# Patient Record
Sex: Male | Born: 1990 | Race: White | Hispanic: No | Marital: Single | Smoking: Never smoker
Health system: Southern US, Community
[De-identification: ages and names within clinical notes are randomized; demographics above are authoritative.]

## PROBLEM LIST (undated history)

## (undated) ENCOUNTER — Emergency Department (HOSPITAL_COMMUNITY): Discharge status: Against Medical Advice | Payer: Self-pay

---
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
## 2015-07-21 ENCOUNTER — Other Ambulatory Visit: Payer: Self-pay | Admitting: Urology

## 2015-07-21 ENCOUNTER — Inpatient Hospital Stay (HOSPITAL_COMMUNITY): Payer: BC Managed Care – PPO | Admitting: Anesthesiology

## 2015-07-21 ENCOUNTER — Encounter (HOSPITAL_COMMUNITY)
Admission: AD | Disposition: A | Discharge status: Home / Self Care | Payer: Self-pay | Source: Ambulatory Visit | Attending: Urology

## 2015-07-21 ENCOUNTER — Ambulatory Visit (HOSPITAL_COMMUNITY)
Admission: AD | Admit: 2015-07-21 | Discharge: 2015-07-21 | DRG: 712 | Disposition: A | Discharge status: Home / Self Care | Payer: BC Managed Care – PPO | Source: Ambulatory Visit | Attending: Urology | Admitting: Urology

## 2015-07-21 ENCOUNTER — Encounter (HOSPITAL_COMMUNITY): Payer: Self-pay | Admitting: Anesthesiology

## 2015-07-21 DIAGNOSIS — N4400 Torsion of testis, unspecified: Secondary | ICD-10-CM | POA: Diagnosis not present

## 2015-07-21 DIAGNOSIS — N5082 Scrotal pain: Secondary | ICD-10-CM | POA: Diagnosis present

## 2015-07-21 HISTORY — PX: ORCHIECTOMY: SHX2116

## 2015-07-21 SURGERY — ORCHIECTOMY
Anesthesia: General | Laterality: Bilateral

## 2015-07-21 MED ORDER — BUPIVACAINE-EPINEPHRINE (PF) 0.5% -1:200000 IJ SOLN
INTRAMUSCULAR | Status: AC
  Filled 2015-07-21: qty 30

## 2015-07-21 MED ORDER — ONDANSETRON HCL 4 MG/2ML IJ SOLN
INTRAMUSCULAR | Status: DC | PRN
  Administered 2015-07-21: 4 mg via INTRAVENOUS

## 2015-07-21 MED ORDER — FENTANYL CITRATE (PF) 100 MCG/2ML IJ SOLN
INTRAMUSCULAR | Status: AC
  Filled 2015-07-21: qty 2

## 2015-07-21 MED ORDER — BUPIVACAINE-EPINEPHRINE (PF) 0.5% -1:200000 IJ SOLN
INTRAMUSCULAR | Status: DC | PRN
  Administered 2015-07-21: 10 mL
  Administered 2015-07-21: 30 mL

## 2015-07-21 MED ORDER — MEPERIDINE HCL 25 MG/ML IJ SOLN
INTRAMUSCULAR | Status: DC | PRN
  Administered 2015-07-21: 25 mg via INTRAVENOUS

## 2015-07-21 MED ORDER — LACTATED RINGERS IV SOLN: INTRAVENOUS | Status: DC

## 2015-07-21 MED ORDER — ONDANSETRON HCL 4 MG/2ML IJ SOLN: 4.0000 mg | Freq: Once | INTRAMUSCULAR | Status: DC | PRN

## 2015-07-21 MED ORDER — CEFAZOLIN SODIUM 1-5 GM-% IV SOLN
1.0000 g | INTRAVENOUS | Status: AC
  Administered 2015-07-21: 2 g via INTRAVENOUS

## 2015-07-21 MED ORDER — MIDAZOLAM HCL 5 MG/5ML IJ SOLN
INTRAMUSCULAR | Status: DC | PRN
  Administered 2015-07-21: 2 mg via INTRAVENOUS

## 2015-07-21 MED ORDER — KETOROLAC TROMETHAMINE 30 MG/ML IJ SOLN
INTRAMUSCULAR | Status: DC | PRN
  Administered 2015-07-21: 30 mg via INTRAVENOUS

## 2015-07-21 MED ORDER — HYDROMORPHONE HCL 1 MG/ML IJ SOLN: 0.5000 mg | INTRAMUSCULAR | Status: DC | PRN

## 2015-07-21 MED ORDER — CEFAZOLIN SODIUM-DEXTROSE 2-3 GM-% IV SOLR
INTRAVENOUS | Status: AC
  Filled 2015-07-21: qty 50

## 2015-07-21 MED ORDER — MEPERIDINE HCL 50 MG/ML IJ SOLN
INTRAMUSCULAR | Status: AC
  Filled 2015-07-21: qty 1

## 2015-07-21 MED ORDER — LIDOCAINE HCL (CARDIAC) 20 MG/ML IV SOLN
INTRAVENOUS | Status: AC
  Filled 2015-07-21: qty 5

## 2015-07-21 MED ORDER — ONDANSETRON HCL 4 MG/2ML IJ SOLN
INTRAMUSCULAR | Status: AC
  Filled 2015-07-21: qty 2

## 2015-07-21 MED ORDER — PROPOFOL 10 MG/ML IV BOLUS
INTRAVENOUS | Status: AC
  Filled 2015-07-21: qty 20

## 2015-07-21 MED ORDER — LIDOCAINE HCL (CARDIAC) 20 MG/ML IV SOLN
INTRAVENOUS | Status: DC | PRN
  Administered 2015-07-21: 50 mg via INTRAVENOUS

## 2015-07-21 MED ORDER — PROPOFOL 10 MG/ML IV BOLUS
INTRAVENOUS | Status: DC | PRN
  Administered 2015-07-21 (×2): 50 mg via INTRAVENOUS
  Administered 2015-07-21: 150 mg via INTRAVENOUS
  Administered 2015-07-21: 50 mg via INTRAVENOUS

## 2015-07-21 MED ORDER — DEXAMETHASONE SODIUM PHOSPHATE 10 MG/ML IJ SOLN
INTRAMUSCULAR | Status: AC
  Filled 2015-07-21: qty 1

## 2015-07-21 MED ORDER — LACTATED RINGERS IV SOLN
INTRAVENOUS | Status: DC | PRN
  Administered 2015-07-21: 18:00:00 via INTRAVENOUS

## 2015-07-21 MED ORDER — MIDAZOLAM HCL 2 MG/2ML IJ SOLN
INTRAMUSCULAR | Status: AC
  Filled 2015-07-21: qty 2

## 2015-07-21 MED ORDER — 0.9 % SODIUM CHLORIDE (POUR BTL) OPTIME
TOPICAL | Status: DC | PRN
  Administered 2015-07-21: 1000 mL

## 2015-07-21 MED ORDER — KETOROLAC TROMETHAMINE 30 MG/ML IJ SOLN
INTRAMUSCULAR | Status: AC
  Filled 2015-07-21: qty 1

## 2015-07-21 MED ORDER — FENTANYL CITRATE (PF) 100 MCG/2ML IJ SOLN
INTRAMUSCULAR | Status: DC | PRN
  Administered 2015-07-21 (×8): 25 ug via INTRAVENOUS

## 2015-07-21 SURGICAL SUPPLY — 34 items
BLADE HEX COATED 2.75 (ELECTRODE) ×3 IMPLANT
BNDG GAUZE ELAST 4 BULKY (GAUZE/BANDAGES/DRESSINGS) ×3 IMPLANT
COVER SURGICAL LIGHT HANDLE (MISCELLANEOUS) ×3 IMPLANT
DECANTER SPIKE VIAL GLASS SM (MISCELLANEOUS) ×3 IMPLANT
DISSECTOR ROUND CHERRY 3/8 STR (MISCELLANEOUS) ×3 IMPLANT
DRAIN PENROSE 18X1/4 LTX STRL (WOUND CARE) ×3 IMPLANT
DRAPE UTILITY 15X26 (DRAPE) ×3 IMPLANT
ELECT NEEDLE TIP 2.8 STRL (NEEDLE) ×3 IMPLANT
ELECT REM PT RETURN 9FT ADLT (ELECTROSURGICAL) ×3
ELECTRODE REM PT RTRN 9FT ADLT (ELECTROSURGICAL) ×1 IMPLANT
GAUZE SPONGE 4X4 16PLY XRAY LF (GAUZE/BANDAGES/DRESSINGS) ×3 IMPLANT
GLOVE BIOGEL M STRL SZ7.5 (GLOVE) ×3 IMPLANT
GOWN STRL REUS W/TWL XL LVL3 (GOWN DISPOSABLE) ×3 IMPLANT
KIT BASIN OR (CUSTOM PROCEDURE TRAY) ×3 IMPLANT
NEEDLE HYPO 22GX1.5 SAFETY (NEEDLE) IMPLANT
NS IRRIG 1000ML POUR BTL (IV SOLUTION) ×3 IMPLANT
PACK GENERAL/GYN (CUSTOM PROCEDURE TRAY) ×3 IMPLANT
SCRUB PCMX 4 OZ (MISCELLANEOUS) ×3 IMPLANT
SUPPORT SCROTAL LG STRP (MISCELLANEOUS) ×2 IMPLANT
SUPPORTER ATHLETIC LG (MISCELLANEOUS) ×1
SUT ETHILON 0 48 LOOP BLK (SUTURE) ×3 IMPLANT
SUT MON AB 5-0 PS2 18 (SUTURE) IMPLANT
SUT VIC AB 3-0 SH 27 (SUTURE)
SUT VIC AB 3-0 SH 27X BRD (SUTURE) IMPLANT
SUT VIC AB 4-0 PS2 27 (SUTURE) ×3 IMPLANT
SUT VIC AB 4-0 RB1 27 (SUTURE)
SUT VIC AB 4-0 RB1 27XBRD (SUTURE) IMPLANT
SUT VIC AB 4-0 SH 27 (SUTURE)
SUT VIC AB 4-0 SH 27XBRD (SUTURE) IMPLANT
SUT VICRYL 0 TIES 12 18 (SUTURE) ×3 IMPLANT
SYR 20CC LL (SYRINGE) IMPLANT
SYR CONTROL 10ML LL (SYRINGE) IMPLANT
TOWEL OR NON WOVEN STRL DISP B (DISPOSABLE) ×3 IMPLANT
WATER STERILE IRR 1500ML POUR (IV SOLUTION) IMPLANT

## 2015-07-21 NOTE — Transfer of Care (Signed)
Immediate Anesthesia Transfer of Care Note  Patient: Ray Mclaughlin  Procedure(s) Performed: Procedure(s): SCROTAL EXPLORATION /POSSIBLE RIGHT ORCHIIECTOMY/LEFT ORCHIOPEXY (Bilateral)  Patient Location: PACU  Anesthesia Type:General  Level of Consciousness:  sedated, patient cooperative and responds to stimulation  Airway & Oxygen Therapy:Patient Spontanous Breathing and Patient connected to face mask oxgen  Post-op Assessment:  Report given to PACU RN and Post -op Vital signs reviewed and stable  Post vital signs:  Reviewed and stable  Last Vitals:  Filed Vitals:   07/21/15 1730  BP: 143/77  Pulse: 98  Temp: 37.3 C  Resp: 18    Complications: No apparent anesthesia complications

## 2015-07-21 NOTE — Op Note (Signed)
Pre-operative diagnosis :   Right testis tortion  Postoperative diagnosis:  Same  Operation: Right scrotal Exploration, Right Orchiectomy; Left Scrotal Exploration and Left orchidopexy  Surgeon:  S. Gaynelle Arabian, MD  First assistant:  None  Anesthesia:  General LMA  Preparation:  After appropriate preanesthesia, the patient was brought to the operative room, placed on the operating table in the dorsal supine position where general LMA anesthesia was introduced. The remaining this position, with the penis and scrotum were prepped with antibody except solution and draped in usual fashion. The right thigh and right scrotum were previously marked with blue marking pen for identification. The history was reviewed. The ultrasound was really reviewed. The armband was double checked.  Review history:  History of Present Illness Consultation for scrotal pain referred by Dr. Delfina Redwood. Patient was pushing something heavy up until 2 weeks ago and noticed periumbilical pain with radiation into the right hemiscrotum. He thought it may be related to pushing the tennis court roller a couple weeks ago and shoveling snow this weekend. The pain suddenly worsened last night with increased pain and swelling on right. Patient was treated with Cipro last month for "epididymitis" and also with meloxicam. His UA is clear. He has no lower urinary tract symptoms. He denies prior inguinal or testicular surgery.    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 He is here with his father.     A stat scrotal ultrasound was obtained which shows no blood flow to the right testicle, slightly enlarged right testicle which was in homogenous and skin thickened over the testicle.    Statement of  Likelihood of Success: Excellent. TIME-OUT observed.:  Procedure: An 8 cm right hemiscrotal incision is made and subcutaneous tissue dissected. There is gross edema of the skin, and of the  subcutaneous tissue. After entering the right hemiscrotum, the tunica vaginalis is identified and entered. The testicles delivered in the wound, and is blackened. It is twisted several times, and is unable to be untwisted. The spermatic cord is grossly edematous. There is no ability to salvage the testicle. Incision made in the testicle, and there is no bleeding in the within the testicle.  I elected to perform orchiectomy, and using Kelly clamps, divided the spermatic cord in 2 multiple sections, doubly clamped the vasculature, and indicated the spermatic cord. The vas was isolated and cauterized. Using 0 Vicryl free ties, all vascular pedicles were doubly ligated. Using quarter percent Marcaine with epinephrine 1 200,000, 30 mL of local anesthetic was injected into the spermatic cord, the stump, and the wound edges. The wound was irrigated with saline solution. Because of the large cavity, and because of chronic irritative fluid, elected to leave a small drain, and a quarter inch Penrose drain was placed to the most deep and a portion of the wound, through a separate stab wound, and sutured in place with a 3-0 Vicryl suture.  The wound was closed in layers with running and interrupted 3-0 Vicryl suture. Following skin closure, sterile dressing was applied using Dermabond.  The left hemiscrotum was then entered with a 5 cm hemiscrotal incision and subcutaneous tennis tissue was dissected with electrosurgical unit. Reactive edema was noted in the subcutaneous tissue. The tunica vaginalis was incised, and normal pink testicle was identified, measuring 4 x 2 cm. The epididymis was normal. The appendix of the epididymis was identified, this was amputated using electrosurgical unit.  Using 3-0 PDS  suture, 3 point fixation was accomplished in the medial, lateral, and the 6:00 position. Following this, testicle was secured in the scrotum, and the wound was closed using 3-0 Vicryl suture in 2 layers. Again,  Dermabond was used to seal the skin. Fluff dressing was used and mesh pants were used. The patient received IV Toradol, as well as IV Ancef during the procedure. He was awakened, and taken to recovery room in good condition.

## 2015-07-21 NOTE — H&P (Signed)
History of Present Illness Consultation for scrotal pain referred by Dr. Delfina Redwood. Patient was pushing something heavy up until 2 weeks ago and noticed periumbilical pain with radiation into the right hemiscrotum. He thought it may be related to pushing the tennis court roller a couple weeks ago and shoveling snow this weekend. The pain suddenly worsened last night with increased pain and swelling on right. Patient was treated with Cipro last month for "epididymitis" and also with meloxicam. His UA is clear. He has no lower urinary tract symptoms. He denies prior inguinal or testicular surgery.    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 He is here with his father.     A stat scrotal ultrasound was obtained which shows no blood flow to the right testicle, slightly enlarged right testicle which was in homogenous and skin thickened over the testicle.   Past Medical History Problems  1. History of concussion [id number])  Surgical History Problems  1. History of Tonsillectomy  Current Meds 1. Acetaminophen TABS;  Therapy: (Recorded:10Jan2017) to Recorded 2. Cipro TABS;  Therapy: (Recorded:10Jan2017) to Recorded  Allergies Medication  1. No Known Drug Allergies  Family History Problems  1. Adopted (Z02.82)  Social History Problems    Never a smoker   No alcohol use   No caffeine use   Single   Student  Review of Systems Genitourinary, constitutional, skin, eye, otolaryngeal, hematologic/lymphatic, cardiovascular, pulmonary, endocrine, musculoskeletal, gastrointestinal, neurological and psychiatric system(s) were reviewed and pertinent findings if present are noted and are otherwise negative.    Vitals Vital Signs [Data Includes: Last 1 Day]  Recorded: [id number] 03:37PM  Height: 6 ft  Weight: 189 lb  BMI Calculated: 25.63 BSA Calculated: 2.08 Blood Pressure: 128 / 69 Temperature: 98.6 F Heart Rate: 92  Physical  Exam Constitutional: Well nourished and well developed . No acute distress.  ENT:. The ears and nose are normal in appearance.  Neck: The appearance of the neck is normal and no neck mass is present.  Pulmonary: No respiratory distress and normal respiratory rhythm and effort.  Cardiovascular: Heart rate and rhythm are normal . No peripheral edema.  Abdomen: The abdomen is soft and nontender. No masses are palpated. No CVA tenderness. No hernias are palpable. No hepatosplenomegaly noted.  Genitourinary: Examination of the penis demonstrates no discharge, no masses, no lesions and a normal meatus. The scrotum is without lesions. The right hemiscrotum is swollen and firm. I can get on top of it and don't appreciate any inguinal mass or hernia.  The skin appears normal. I can't appreciate any areas of fluctuance or drainage. The right hemiscrotum is heavy and exquisitely tender. I can barely touch it. The left epididymis is palpably normal and non-tender. The left testis is non-tender and without masses.  Lymphatics: The femoral and inguinal nodes are not enlarged or tender.  Skin: Normal skin turgor, no visible rash and no visible skin lesions.  Neuro/Psych:. Mood and affect are appropriate.    Results/Data Urine [Data Includes: Last 1 Day]   [id number]  COLOR YELLOW   APPEARANCE CLEAR   SPECIFIC GRAVITY 1.025   pH 7.0   GLUCOSE NEGATIVE   BILIRUBIN NEGATIVE   KETONE NEGATIVE   BLOOD NEGATIVE   PROTEIN NEGATIVE   NITRITE NEGATIVE   LEUKOCYTE ESTERASE NEGATIVE    Old records or history reviewed:Marland Kitchen    Procedure Patient was given morphine 4 mg IM in the buttocks  Assessment Assessed  1. Scrotal pain (N50.82)  Plan  Health Maintenance  1. UA With REFLEX; [Do Not Release]; Status:Complete;   [id number] 03:24PM Scrotal pain  2. Administer: Morphine Sulfate 4 MG/ML Injection Solution; INJECT 1  ML Intramuscular; To  Be Done: [id number] 3. SCROTAL U/S; Status:Complete;   [id number] 04:19PM  Follow-up Schedule Surgery Office Follow-up Status: Hold For - Appointment Requested for: [id number] Ordered;  For: Scrotal pain; Ordered By: Festus Aloe Performed:  [id number] Marked Important   Discussion/Summary Right hemiscrotal pain and swelling-suspect testicular torsion based on exam and u/s. I can barely touch the scrotum -- I don't think I can perform a manual detorsion. I discussed with the patient and his father the nature risks benefits and alternatives to scrotal exploration and possible right orchiectomy and left orchiopexy. All questions answered and they elect to proceed. We discussed risks in the long term of orchiectomy such as low testosterone or low sperm counts and infertility. I discussed with the on-call urologist Dr. Gaynelle Arabian who will perform the procedure ASAP and I discussed with the patient and his father Dr. Era Bumpers will be performing the procedure.      cc: Dr. Donell Beers Electronically signed by : Festus Aloe, M.D.; Jul 21 2015  4:57PM EST

## 2015-07-21 NOTE — Interval H&P Note (Signed)
History and Physical Interval Note:  07/21/2015 6:19 PM  Ray Mclaughlin  has presented today for surgery, with the diagnosis of scrotal pain/possible torsion  The various methods of treatment have been discussed with the patient and family. After consideration of risks, benefits and other options for treatment, the patient has consented to  Procedure(s): SCROTAL EXPLORATION /POSSIBLE RIGHT ORCHIIECTOMY/LEFT ORCHIOPEXY (Bilateral) as a surgical intervention .  The patient's history has been reviewed, patient examined, no change in status, stable for surgery.  I have reviewed the patient's chart and labs.  Questions were answered to the patient's satisfaction.     [name] I [name]  Exam: swollen , edematous, purple Right hemiscrotum, marked. Discussed orchiectomy with pt. ( possible)

## 2015-07-21 NOTE — Discharge Instructions (Signed)
Dr Arlyn Leak Discharge Instructions  -Only Sponge Baths until seen by doctor -Call for follow up appointment for this Thursday or Friday  -Place Ice Pack to Incision -Call for Temp> 100.5  Office Number: [phone]     Testicular Torsion Testicular torsion is a twisting of the spermatic cord, artery, and vein that go to the testicle. This twisting cuts off the blood supply to everything in the sac that contains the testes, blood vessels, and part of the spermatic cord (scrotum). Testicular torsion is most commonly seen in newborn and adolescent males. It can also occur before birth. Testicular torsion requires emergency treatment. The testicle usually can be saved if the torsion is treated within 6 hours of onset. If the torsion is left untreated for too long, the testicle will die and have to be removed.  CAUSES  Torsion can be caused by a hit on the scrotum or by certain movements during exercise. In some males, testicular torsion is more common because the connection of their testicle to a specific tissue in their scrotum developed in the wrong place, allowing the testicle to rotate and the cord to get twisted.  SIGNS AND SYMPTOMS  The main symptom of testicular torsion is pain in your testicle. The scrotum may be swollen, red, hard, and very tender. There will be excess fluid in the tissue (edema).The testicle may be higher than normal in the scrotum. The skin of the scrotum may be stuck to the testicle. You may have nausea, vomiting, and a fever. DIAGNOSIS  Often testicular torsion is diagnosed through a physical exam. Sometimes imaging exams and tests to measure blood flow may be done. TREATMENT  A manual untwisting of the testicle may be done when the testicle is still mobile and the maneuver is not too painful. However, surgery usually is necessary and should be done as soon as possible after torsion occurs. During surgery, the testicle is untwisted and evaluated and possibly  removed.    This information is not intended to replace advice given to you by your health care provider. Make sure you discuss any questions you have with your health care provider.   Document Released: 06/27/2005 Document Revised: 07/02/2013 Document Reviewed: 12/17/2012 Elsevier Interactive Patient Education [employer].

## 2015-07-21 NOTE — Anesthesia Postprocedure Evaluation (Signed)
Anesthesia Post Note  Patient: Ray Mclaughlin  Procedure(s) Performed: Procedure(s) (LRB): SCROTAL EXPLORATION /POSSIBLE RIGHT ORCHIIECTOMY/LEFT ORCHIOPEXY (Bilateral)  Patient location during evaluation: PACU Anesthesia Type: General Level of consciousness: awake, oriented and patient cooperative Pain management: pain level controlled Vital Signs Assessment: post-procedure vital signs reviewed and stable Respiratory status: spontaneous breathing and respiratory function stable Cardiovascular status: blood pressure returned to baseline and stable Anesthetic complications: no    Last Vitals:  Filed Vitals:   07/21/15 1730 07/21/15 1949  BP: 143/77 128/62  Pulse: 98 90  Temp: 37.3 C 36.9 C  Resp: 18 16    Last Pain:  Filed Vitals:   07/21/15 1959  PainSc: 0-No pain                 [name]

## 2015-07-21 NOTE — Anesthesia Preprocedure Evaluation (Addendum)
Anesthesia Evaluation  Patient identified by MRN, date of birth, ID band Patient awake    Reviewed: Allergy & Precautions, NPO status , Patient's Chart, lab work & pertinent test results  Airway Mallampati: I  TM Distance: >3 FB     Dental   Pulmonary    Pulmonary exam normal        Cardiovascular Normal cardiovascular exam     Neuro/Psych    GI/Hepatic   Endo/Other    Renal/GU      Musculoskeletal   Abdominal   Peds  Hematology   Anesthesia Other Findings   Reproductive/Obstetrics                            Anesthesia Physical Anesthesia Plan  ASA: I and emergent  Anesthesia Plan: General   Post-op Pain Management:    Induction: Intravenous  Airway Management Planned: LMA  Additional Equipment:   Intra-op Plan:   Post-operative Plan: Extubation in OR  Informed Consent: I have reviewed the patients History and Physical, chart, labs and discussed the procedure including the risks, benefits and alternatives for the proposed anesthesia with the patient or authorized representative who has indicated his/her understanding and acceptance.     Plan Discussed with: CRNA, Anesthesiologist and Surgeon  Anesthesia Plan Comments:        Anesthesia Quick Evaluation

## 2015-07-21 NOTE — Anesthesia Procedure Notes (Signed)
Procedure Name: Intubation Date/Time: 07/21/2015 6:25 PM Performed by: Anne Fu Pre-anesthesia Checklist: Patient identified, Emergency Drugs available, Suction available, Patient being monitored and Timeout performed Patient Re-evaluated:Patient Re-evaluated prior to inductionOxygen Delivery Method: Circle system utilized Preoxygenation: Pre-oxygenation with 100% oxygen Intubation Type: IV induction Ventilation: Mask ventilation without difficulty Laryngoscope Size: Mac and 4 Tube type: Oral Tube size: 7.5 mm Number of attempts: 1 Airway Equipment and Method: Stylet Placement Confirmation: ETT inserted through vocal cords under direct vision,  positive ETCO2,  CO2 detector and breath sounds checked- equal and bilateral Tube secured with: Tape Dental Injury: Teeth and Oropharynx as per pre-operative assessment

## 2015-07-22 ENCOUNTER — Encounter (HOSPITAL_COMMUNITY): Payer: Self-pay | Admitting: Urology

## 2015-07-22 NOTE — Addendum Note (Signed)
Addendum  created 07/22/15 1034 by Lollie Sails, CRNA   Modules edited: Charges VN

## 2015-07-31 NOTE — Discharge Summary (Signed)
Physician Discharge Summary  Patient ID: Ray Mclaughlin MRN: [id number] DOB/AGE: 02-23-1991 23 y.o.  Admit date: 07/21/2015 Discharge date: 07/31/2015  Admission Diagnoses: Right testicular tortion  Discharge Diagnoses: Same Diagnosis: Pathology:  Testis, other, right - DIFFUSE HEMORRHAGE AND NECROSIS CONSISTENT WITH TORSION. - NO MALIGNANCY. Vicente Males MD Pathologist, Electronic Signature (Case signed 07/23/2015) Specimen Gross and Clinical Information Specimen(s) Obtained: Testis, other, right Specimen Clinical  Discharged Condition: stable  Hospital Course: Right orchiectomy, contralateral orchidopexy  Significant Diagnostic Studies: Office scrotal u/s: No blood flow to Right testis Discharge Exam: Blood pressure 132/77, pulse 89, temperature 98.5 F (36.9 C), temperature source Oral, resp. rate 16, height 6' (1.829 m), weight 86.047 kg (189 lb 11.2 oz), SpO2 97 %.   Disposition: 01-Home or Self Care     Medication List    ASK your doctor about these medications        acetaminophen 325 MG tablet  Commonly known as:  TYLENOL  Take 975 mg by mouth every 6 (six) hours as needed for mild pain or moderate pain.     bismuth subsalicylate 99991111 MG chewable tablet  Commonly known as:  PEPTO BISMOL  Chew 262 mg by mouth as needed for indigestion.     ciprofloxacin 500 MG tablet  Commonly known as:  CIPRO  Take 500 mg by mouth 2 (two) times daily.     meloxicam 15 MG tablet  Commonly known as:  MOBIC  Take 15 mg by mouth daily as needed for pain (swelling pain).         SignedAilene Rud 07/31/2015, 7:54 AM

## 2018-12-17 ENCOUNTER — Ambulatory Visit: Payer: BC Managed Care – PPO | Admitting: Family Medicine

## 2019-03-04 ENCOUNTER — Ambulatory Visit: Payer: BC Managed Care – PPO | Admitting: Nurse Practitioner

## 2019-03-14 ENCOUNTER — Telehealth: Payer: Self-pay | Admitting: Nurse Practitioner

## 2019-03-14 NOTE — Telephone Encounter (Signed)

## 2019-03-15 ENCOUNTER — Ambulatory Visit (INDEPENDENT_AMBULATORY_CARE_PROVIDER_SITE_OTHER): Payer: Managed Care, Other (non HMO) | Admitting: Nurse Practitioner

## 2019-03-15 ENCOUNTER — Encounter: Payer: Self-pay | Admitting: Nurse Practitioner

## 2019-03-15 ENCOUNTER — Other Ambulatory Visit: Payer: Self-pay

## 2019-03-15 VITALS — BP 120/80 | HR 56 | Temp 97.9°F | Ht 72.0 in | Wt 234.0 lb

## 2019-03-15 DIAGNOSIS — Z9079 Acquired absence of other genital organ(s): Secondary | ICD-10-CM | POA: Insufficient documentation

## 2019-03-15 DIAGNOSIS — Z13220 Encounter for screening for lipoid disorders: Secondary | ICD-10-CM

## 2019-03-15 DIAGNOSIS — Z136 Encounter for screening for cardiovascular disorders: Secondary | ICD-10-CM | POA: Diagnosis not present

## 2019-03-15 DIAGNOSIS — D229 Melanocytic nevi, unspecified: Secondary | ICD-10-CM | POA: Diagnosis not present

## 2019-03-15 DIAGNOSIS — M79675 Pain in left toe(s): Secondary | ICD-10-CM | POA: Diagnosis not present

## 2019-03-15 DIAGNOSIS — E6609 Other obesity due to excess calories: Secondary | ICD-10-CM

## 2019-03-15 DIAGNOSIS — Z0001 Encounter for general adult medical examination with abnormal findings: Secondary | ICD-10-CM | POA: Diagnosis not present

## 2019-03-15 DIAGNOSIS — Z6831 Body mass index (BMI) 31.0-31.9, adult: Secondary | ICD-10-CM | POA: Insufficient documentation

## 2019-03-15 LAB — LIPID PANEL
Cholesterol: 174 mg/dL (ref 0–200)
HDL: 40 mg/dL (ref >39.00)
LDL Cholesterol: 114 mg/dL — ABNORMAL HIGH (ref 0–99)
NonHDL: 133.92
Total CHOL/HDL Ratio: 4
Triglycerides: 101 mg/dL (ref 0.0–149.0)
VLDL: 20.2 mg/dL (ref 0.0–40.0)

## 2019-03-15 LAB — COMPREHENSIVE METABOLIC PANEL
ALT: 33 U/L (ref 0–53)
AST: 20 U/L (ref 0–37)
Albumin: 4.4 g/dL (ref 3.5–5.2)
Alkaline Phosphatase: 66 U/L (ref 39–117)
BUN: 10 mg/dL (ref 6–23)
CO2: 30 mEq/L (ref 19–32)
Calcium: 9.7 mg/dL (ref 8.4–10.5)
Chloride: 101 mEq/L (ref 96–112)
Creatinine, Ser: 1.06 mg/dL (ref 0.40–1.50)
GFR: 84.29 mL/min (ref >60.00)
Glucose, Bld: 89 mg/dL (ref 70–99)
Potassium: 4 mEq/L (ref 3.5–5.1)
Sodium: 138 mEq/L (ref 135–145)
Total Bilirubin: 0.7 mg/dL (ref 0.2–1.2)
Total Protein: 7.4 g/dL (ref 6.0–8.3)

## 2019-03-15 LAB — CBC
HCT: 44.4 % (ref 39.0–52.0)
Hemoglobin: 15.2 g/dL (ref 13.0–17.0)
MCHC: 34.3 g/dL (ref 30.0–36.0)
MCV: 88.4 fl (ref 78.0–100.0)
Platelets: 220 10*3/uL (ref 150.0–400.0)
RBC: 5.02 Mil/uL (ref 4.22–5.81)
RDW: 14.4 % (ref 11.5–15.5)
WBC: 7.4 10*3/uL (ref 4.0–10.5)

## 2019-03-15 LAB — TSH: TSH: 2.09 u[IU]/mL (ref 0.35–4.50)

## 2019-03-15 LAB — URIC ACID: Uric Acid, Serum: 5.8 mg/dL (ref 4.0–7.8)

## 2019-03-15 NOTE — Assessment & Plan Note (Signed)
Chest and right calf

## 2019-03-15 NOTE — Patient Instructions (Signed)
Thank you for choosing [facility] for your health care needs.  Go to lab for blood draw. You will be called with lab results, to schedule appts with dermatology, podiatry and weight loss clinic.  Continue Heart healthy diet and regular exercise to reach healthy weight.  Call office to schedule appt for influenza vaccine when ready.  Preventive Care 16-26 Years Old, Male Preventive care refers to lifestyle choices and visits with your health care provider that can promote health and wellness. This includes:  A yearly physical exam. This is also called an annual well check.  Regular dental and eye exams.  Immunizations.  Screening for certain conditions.  Healthy lifestyle choices, such as eating a healthy diet, getting regular exercise, not using drugs or products that contain nicotine and tobacco, and limiting alcohol use. What can I expect for my preventive care visit? Physical exam Your health care provider will check:  Height and weight. These may be used to calculate body mass index (BMI), which is a measurement that tells if you are at a healthy weight.  Heart rate and blood pressure.  Your skin for abnormal spots. Counseling Your health care provider may ask you questions about:  Alcohol, tobacco, and drug use.  Emotional well-being.  Home and relationship well-being.  Sexual activity.  Eating habits.  Work and work Statistician. What immunizations do I need?  Influenza (flu) vaccine  This is recommended every year. Tetanus, diphtheria, and pertussis (Tdap) vaccine  You may need a Td booster every 10 years. Varicella (chickenpox) vaccine  You may need this vaccine if you have not already been vaccinated. Human papillomavirus (HPV) vaccine  If recommended by your health care provider, you may need three doses over 6 months. Measles, mumps, and rubella (MMR) vaccine  You may need at least one dose of MMR. You may also need a second dose. Meningococcal  conjugate (MenACWY) vaccine  One dose is recommended if you are 71-79 years old and a Market researcher living in a residence hall, or if you have one of several medical conditions. You may also need additional booster doses. Pneumococcal conjugate (PCV13) vaccine  You may need this if you have certain conditions and were not previously vaccinated. Pneumococcal polysaccharide (PPSV23) vaccine  You may need one or two doses if you smoke cigarettes or if you have certain conditions. Hepatitis A vaccine  You may need this if you have certain conditions or if you travel or work in places where you may be exposed to hepatitis A. Hepatitis B vaccine  You may need this if you have certain conditions or if you travel or work in places where you may be exposed to hepatitis B. Haemophilus influenzae type b (Hib) vaccine  You may need this if you have certain risk factors. You may receive vaccines as individual doses or as more than one vaccine together in one shot (combination vaccines). Talk with your health care provider about the risks and benefits of combination vaccines. What tests do I need? Blood tests  Lipid and cholesterol levels. These may be checked every 5 years starting at age 28.  Hepatitis C test.  Hepatitis B test. Screening   Diabetes screening. This is done by checking your blood sugar (glucose) after you have not eaten for a while (fasting).  Sexually transmitted disease (STD) testing. Talk with your health care provider about your test results, treatment options, and if necessary, the need for more tests. Follow these instructions at home: Eating and drinking  Eat a diet that includes fresh fruits and vegetables, whole grains, lean protein, and low-fat dairy products.  Take vitamin and mineral supplements as recommended by your health care provider.  Do not drink alcohol if your health care provider tells you not to drink.  If you drink alcohol: ?  Limit how much you have to 0-2 drinks a day. ? Be aware of how much alcohol is in your drink. In the [place] one drink equals one 12 oz bottle of beer (355 mL), one 5 oz glass of wine (148 mL), or one 1 oz glass of hard liquor (44 mL). Lifestyle  Take daily care of your teeth and gums.  Stay active. Exercise for at least 30 minutes on 5 or more days each week.  Do not use any products that contain nicotine or tobacco, such as cigarettes, e-cigarettes, and chewing tobacco. If you need help quitting, ask your health care provider.  If you are sexually active, practice safe sex. Use a condom or other form of protection to prevent STIs (sexually transmitted infections). What's next?  Go to your health care provider once a year for a well check visit.  Ask your health care provider how often you should have your eyes and teeth checked.  Stay up to date on all vaccines. This information is not intended to replace advice given to you by your health care provider. Make sure you discuss any questions you have with your health care provider. Document Released: 08/23/2001 Document Revised: 06/21/2018 Document Reviewed: 06/21/2018 Elsevier Patient Education  2020 [employer].

## 2019-03-15 NOTE — Progress Notes (Signed)
Subjective:    Patient ID: Ray Mclaughlin, male    DOB: Dec 09, 1990, 28 y.o.   MRN: [id number]  Patient presents today for complete physical and to discuss weight loss, skin lesions and toe pain  Toe Pain  There was no injury mechanism. Pain location: left great toe. The quality of the pain is described as aching and shooting. The pain has been intermittent since onset. Pertinent negatives include no inability to bear weight, loss of motion, loss of sensation, muscle weakness, numbness or tingling. He reports no foreign bodies present. The symptoms are aggravated by palpation, movement and weight bearing. He has tried rest for the symptoms. The treatment provided mild relief.  Rash This is a chronic problem. The current episode started more than 1 year ago. The problem is unchanged. The affected locations include the neck, chest, right lower leg and right ankle. The rash is characterized by itchiness (intermittent bleeding on lesion on right calf). He was exposed to nothing. Associated symptoms include joint pain. Pertinent negatives include no congestion, cough, diarrhea, facial edema, fatigue, fever, shortness of breath or sore throat. Past treatments include nothing. There is no history of allergies, asthma, eczema or varicella.  toe pain has been ongoing for over 1year, intermittent joint swelling but no redness.  Sexual History (orientation,birth control, marital status, STD):engaged, male partner, sexually active, no condom use  Depression/Suicide: Depression screen [facility] 2/9 03/15/2019  Decreased Interest 0  Down, Depressed, Hopeless 0  PHQ - 2 Score 0   Vision:delayed due to COVID pandemic, will schedule  Dental:delayed due to COVID pandemic, will schedule  Immunizations: (TDAP, Hep C screen, Pneumovax, Influenza, zoster)  Health Maintenance  Topic Date Due  . HIV Screening  12/16/2007  . Tetanus Vaccine  12/16/2011  . Flu Shot  10/09/2019*  *Topic was postponed. The date shown is  not the original due date.   Obesity: lowest weight 175lbs 4years ago Diet:regular, has difficulty making healthy choices. He will like referral to nutritionist. Weight:  Wt Readings from Last 3 Encounters:  03/15/19 234 lb (106.1 kg)  07/21/15 189 lb 11.2 oz (86 kg)   Exercise:minimal due to desk job, the has led to 59lbs weight gain over 43yrs.  Fall Risk: Fall Risk  03/15/2019  Falls in the past year? 0   Advanced Directive: Advanced Directives 07/21/2015  Does Patient Have a Medical Advance Directive? No  Would patient like information on creating a medical advance directive? Yes - Educational materials given     Medications and allergies reviewed with patient and updated if appropriate.  Patient Active Problem List   Diagnosis Date Noted  . 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 Great toe pain, left 03/15/2019  . Class 1 obesity due to excess calories without serious comorbidity with body mass index (BMI) of 31.0 to 31.9 in adult 03/15/2019  . 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   No current facility-administered medications on file prior to visit.     History reviewed. No pertinent past medical history.  Past Surgical History:  Procedure Laterality Date  . ORCHIECTOMY Bilateral 07/21/2015   Procedure: SCROTAL EXPLORATION , RIGHT ORCHIIECTOMY /LEFT ORCHIOPEXY;  Surgeon: Carolan Clines, MD;  Location: [facility];  Service: Urology;  Laterality: Bilateral;  . TONSILLECTOMY  2001    Social History   Socioeconomic History  . Marital status: Single    Spouse name: Not on file  . Number of children: 0  . Years of education:  Not on file  . Highest education level: Not on file  Occupational History    Comment: IT  Social Needs  . Financial resource strain: Not on file  . Food insecurity    Worry: Not on file    Inability: Not on file  . Transportation needs    Medical: Not on file    Non-medical: Not on file  Tobacco Use   . Smoking status: Never Smoker  . Smokeless tobacco: Never Used  Substance and Sexual Activity  . Alcohol use: No  . Drug use: No  . Sexual activity: Yes    Birth control/protection: None    Comment: fiancee with IUD  Lifestyle  . Physical activity    Days per week: Not on file    Minutes per session: Not on file  . Stress: Not on file  Relationships  . Social Herbalist on phone: Not on file    Gets together: Not on file    Attends religious service: Not on file    Active member of club or organization: Not on file    Attends meetings of clubs or organizations: Not on file    Relationship status: Not on file  Other Topics Concern  . Not on file  Social History Narrative  . Not on file   Family History  Adopted: Yes  Family history unknown: Yes       Review of Systems  Constitutional: Negative for chills, fatigue, fever, malaise/fatigue and weight loss.  HENT: Negative for congestion and sore throat.   Eyes:       Negative for visual changes  Respiratory: Negative for cough and shortness of breath.   Cardiovascular: Negative for chest pain, palpitations and leg swelling.  Gastrointestinal: Negative for blood in stool, constipation, diarrhea, heartburn and melena.  Genitourinary: Negative for dysuria, frequency and urgency.  Musculoskeletal: Positive for joint pain. Negative for back pain, falls and myalgias.  Skin: Positive for rash.  Neurological: Negative for dizziness, tingling, sensory change, focal weakness, weakness, numbness and headaches.  Endo/Heme/Allergies: Does not bruise/bleed easily.  Psychiatric/Behavioral: Negative for depression, substance abuse and suicidal ideas. The patient is not nervous/anxious and does not have insomnia.     Objective:   Vitals:   03/15/19 0917  BP: 120/80  Pulse: (!) 56  Temp: 97.9 F (36.6 C)  SpO2: 97%    Body mass index is 31.74 kg/m.   Physical Examination:  Physical Exam Vitals signs reviewed.   Constitutional:      General: He is not in acute distress.    Appearance: He is well-developed.  HENT:     Head: Normocephalic.     Right Ear: Tympanic membrane, ear canal and external ear normal.     Left Ear: Tympanic membrane, ear canal and external ear normal.  Eyes:     Extraocular Movements: Extraocular movements intact.     Conjunctiva/sclera: Conjunctivae normal.  Neck:     Musculoskeletal: Normal range of motion and neck supple.  Cardiovascular:     Rate and Rhythm: Normal rate and regular rhythm.     Pulses: Normal pulses.     Heart sounds: Normal heart sounds.  Pulmonary:     Effort: Pulmonary effort is normal. No respiratory distress.     Breath sounds: Normal breath sounds.  Chest:     Chest wall: No tenderness.  Abdominal:     Palpations: Abdomen is soft.     Tenderness: There is no abdominal tenderness.  Musculoskeletal:  Right ankle: Normal.     Left ankle: Normal.       Back:     Right lower leg: No edema.     Left lower leg: No edema.       Legs:     Right foot: Normal.     Left foot: Decreased range of motion. Tenderness and bony tenderness present. No swelling, deformity or laceration.       Feet:  Lymphadenopathy:     Cervical: No cervical adenopathy.  Skin:    General: Skin is warm and dry.     Findings: No erythema.  Neurological:     Mental Status: He is alert and oriented to person, place, and time.     Gait: Gait normal.     Deep Tendon Reflexes: Reflexes are normal and symmetric.  Psychiatric:        Mood and Affect: Mood normal.        Behavior: Behavior normal.        Thought Content: Thought content normal.     ASSESSMENT and PLAN:  Daniyal was seen today for establish care.  Diagnoses and all orders for this visit:  Encounter for preventative adult health care exam with abnormal findings -     CBC -     Comprehensive metabolic panel -     TSH -     Lipid panel  Encounter for lipid screening for cardiovascular disease   Great toe pain, left -     Uric acid -     Ambulatory referral to Podiatry  Atypical nevi -     Ambulatory referral to Dermatology  Class 1 obesity due to excess calories without serious comorbidity with body mass index (BMI) of 31.0 to 31.9 in adult -     Amb Ref to Medical Weight Management   Atypical nevi Chest and right calf     Problem List Items Addressed This Visit      Musculoskeletal and Integument   Atypical nevi    Chest and right calf      Relevant Orders   Ambulatory referral to Dermatology     Other   Class 1 obesity due to excess calories without serious comorbidity with body mass index (BMI) of 31.0 to 31.9 in adult   Relevant Orders   Amb Ref to Medical Weight Management   Great toe pain, left   Relevant Orders   Uric acid   Ambulatory referral to Podiatry    Other Visit Diagnoses    Encounter for preventative adult health care exam with abnormal findings    -  Primary   Relevant Orders   CBC   Comprehensive metabolic panel   TSH   Lipid panel   Encounter for lipid screening for cardiovascular disease           Follow up: Return if symptoms worsen or fail to improve.  Wilfred Lacy, NP

## 2019-05-14 ENCOUNTER — Other Ambulatory Visit: Payer: Self-pay

## 2019-05-14 ENCOUNTER — Encounter: Payer: Self-pay | Admitting: Nurse Practitioner

## 2019-05-14 ENCOUNTER — Ambulatory Visit (INDEPENDENT_AMBULATORY_CARE_PROVIDER_SITE_OTHER): Payer: Managed Care, Other (non HMO) | Admitting: Nurse Practitioner

## 2019-05-14 VITALS — BP 118/77 | HR 72 | Temp 96.6°F | Ht 72.0 in | Wt 216.0 lb

## 2019-05-14 DIAGNOSIS — R1010 Upper abdominal pain, unspecified: Secondary | ICD-10-CM

## 2019-05-14 DIAGNOSIS — R110 Nausea: Secondary | ICD-10-CM | POA: Diagnosis not present

## 2019-05-14 MED ORDER — OMEPRAZOLE 20 MG PO CPDR: 20.0000 mg | DELAYED_RELEASE_CAPSULE | Freq: Every day | ORAL | 0 refills | Status: DC

## 2019-05-14 MED ORDER — FAMOTIDINE 20 MG PO TABS: 20.0000 mg | ORAL_TABLET | Freq: Every day | ORAL | 0 refills | Status: DC

## 2019-05-14 NOTE — Progress Notes (Signed)
Virtual Visit via Video Note  I connected with Ray Mclaughlin on 05/14/19 at 11:30 AM EST by a video enabled telemedicine application and verified that I am speaking with the correct person using two identifiers.  Location: Patient: Home Provider: Office Fiancee present during video call   I discussed the limitations of evaluation and management by telemedicine and the availability of in person appointments. The patient expressed understanding and agreed to proceed.  CC: pt is abd pain,feel bloated,heavy,dull pain move toward lower right back--feel like vomit/going on 3 days--notice after eat greazy food/  History of Present Illness: Abdominal Pain: Patient complains of abdominal pain. The pain is described as colicky, dull and pressure-like. Pain is located in the diffusely with radiation to back. Onset was 3 days ago. Symptoms have been unchanged since. Aggravating factors: eating and fatty foods.  Alleviating factors: none. Associated symptoms: anorexia and nausea. The patient denies chills, constipation, diarrhea, dysuria, fever, frequency, hematochezia, hematuria, melena, myalgias and sweats. Also noted 22lbs weight loss. He states his diet did not change prior to cute illness but he has been exercising more.  Wt Readings from Last 3 Encounters:  05/14/19 216 lb (98 kg)  03/15/19 234 lb (106.1 kg)  07/21/15 189 lb 11.2 oz (86 kg)   Reviewed medication list, medical and surgical history.  Observations/Objective: Reviewed vital signs provided. Physical Exam  Constitutional: He is oriented to person, place, and time. No distress.  Cardiovascular: Normal rate.  Pulmonary/Chest: Effort normal.  Abdominal: He exhibits no distension.  Neurological: He is alert and oriented to person, place, and time.  Skin: He is not diaphoretic.  Psychiatric: He has a normal mood and affect. His behavior is normal. Thought content normal.  Vitals reviewed.  Assessment and Plan: Ray Mclaughlin was seen today  for abdominal pain.  Diagnoses and all orders for this visit:  Nausea -     US Abdomen Limited RUQ -     omeprazole (PRILOSEC) 20 MG capsule; Take 1 capsule (20 mg total) by mouth daily before breakfast. -     famotidine (PEPCID) 20 MG tablet; Take 1 tablet (20 mg total) by mouth at bedtime.  Pain of upper abdomen -     US Abdomen Limited RUQ -     omeprazole (PRILOSEC) 20 MG capsule; Take 1 capsule (20 mg total) by mouth daily before breakfast. -     famotidine (PEPCID) 20 MG tablet; Take 1 tablet (20 mg total) by mouth at bedtime.    Follow Up Instructions: See avs instructions   I discussed the assessment and treatment plan with the patient. The patient was provided an opportunity to ask questions and all were answered. The patient agreed with the plan and demonstrated an understanding of the instructions.   The patient was advised to call back or seek an in-person evaluation if the symptoms worsen or if the condition fails to improve as anticipated.  Wilfred Lacy, NP

## 2019-05-14 NOTE — Patient Instructions (Addendum)
You will be contacted to schedule appt for ABD [place].  Start omeprazole in AM and pepcid at hs. Maintain Brat diet  X 2days, then slowly advance diet as tolerated If no improvement in 1week, will need OV   Bland Diet A bland diet consists of foods that are often soft and do not have a lot of fat, fiber, or extra seasonings. Foods without fat, fiber, or seasoning are easier for the body to digest. They are also less likely to irritate your mouth, throat, stomach, and other parts of your digestive system. A bland diet is sometimes called a BRAT diet. What is my plan? Your health care provider or food and nutrition specialist (dietitian) may recommend specific changes to your diet to prevent symptoms or to treat your symptoms. These changes may include:  Eating small meals often.  Cooking food until it is soft enough to chew easily.  Chewing your food well.  Drinking fluids slowly.  Not eating foods that are very spicy, sour, or fatty.  Not eating citrus fruits, such as oranges and grapefruit. What do I need to know about this diet?  Eat a variety of foods from the bland diet food list.  Do not follow a bland diet longer than needed.  Ask your health care provider whether you should take vitamins or supplements. What foods can I eat? Grains  Hot cereals, such as cream of wheat. Rice. Bread, crackers, or tortillas made from refined white flour. Vegetables Canned or cooked vegetables. Mashed or boiled potatoes. Fruits  Bananas. Applesauce. Other types of cooked or canned fruit with the skin and seeds removed, such as canned peaches or pears. Meats and other proteins  Scrambled eggs. Creamy peanut butter or other nut butters. Lean, well-cooked meats, such as chicken or fish. Tofu. Soups or broths. Dairy Low-fat dairy products, such as milk, cottage cheese, or yogurt. Beverages  Water. Herbal tea. Apple juice. Fats and oils Mild salad dressings. Canola or olive oil. Sweets and  desserts Pudding. Custard. Fruit gelatin. Ice cream. The items listed above may not be a complete list of recommended foods and beverages. Contact a dietitian for more options. What foods are not recommended? Grains Whole grain breads and cereals. Vegetables Raw vegetables. Fruits Raw fruits, especially citrus, berries, or dried fruits. Dairy Whole fat dairy foods. Beverages Caffeinated drinks. Alcohol. Seasonings and condiments Strongly flavored seasonings or condiments. Hot sauce. Salsa. Other foods Spicy foods. Fried foods. Sour foods, such as pickled or fermented foods. Foods with high sugar content. Foods high in fiber. The items listed above may not be a complete list of foods and beverages to avoid. Contact a dietitian for more information. Summary  A bland diet consists of foods that are often soft and do not have a lot of fat, fiber, or extra seasonings.  Foods without fat, fiber, or seasoning are easier for the body to digest.  Check with your health care provider to see how long you should follow this diet plan. It is not meant to be followed for long periods. This information is not intended to replace advice given to you by your health care provider. Make sure you discuss any questions you have with your health care provider. Document Released: 10/19/2015 Document Revised: 07/26/2017 Document Reviewed: 07/26/2017 Elsevier Patient Education  2020 [employer].

## 2019-05-15 ENCOUNTER — Ambulatory Visit: Payer: Self-pay | Admitting: *Deleted

## 2019-05-15 DIAGNOSIS — R110 Nausea: Secondary | ICD-10-CM

## 2019-05-15 MED ORDER — ONDANSETRON HCL 4 MG PO TABS: 4.0000 mg | ORAL_TABLET | Freq: Three times a day (TID) | ORAL | 0 refills | Status: DC | PRN

## 2019-05-15 NOTE — Telephone Encounter (Signed)
Sent zofran for nausea. I see he has appt for ABD [place] on 05/17/2019. If he is unable to maintain adequate oral hydration despite use of zofran, please go to ED.

## 2019-05-15 NOTE — Telephone Encounter (Signed)
LVM for the to call back,  Baldo Ash FYI--pt had ov yesterday.

## 2019-05-15 NOTE — Addendum Note (Signed)
Addended by: Wilfred Lacy L on: 05/15/2019 04:11 PM   Modules accepted: Orders

## 2019-05-15 NOTE — Telephone Encounter (Signed)
Summary: Clinical Advice   Spouse would like to discuss 148 Blood Sugar reading, night sweats, abdominal pain (abdominal pain was discussed at 05/14/2019 Office Visit with PCP)  98.2 no fever. Patient is with spouse currently. Seeking clinical advice      Patient had glucose randomly checked 1 1/2 hours after meal. Patient was not having symptoms- but wanted to make PCP aware of level. Patient states he has had some improvement in abdominal discomfort since changing diet. Patient states he is reluctant to take any medication at this time and he sates he still has waves of nausea. Advised patient to take medication as needed- not to let his "not wanting to take medication" make him feel worse. [place] has been scheduled for Friday.  Reason for Disposition . Blood glucose 70-240 mg/dL (3.9 -13.3 mmol/L)  Answer Assessment - Initial Assessment Questions 1. BLOOD GLUCOSE: "What is your blood glucose level?"      148 2. ONSET: "When did you check the blood glucose?"     2:45 3. USUAL RANGE: "What is your glucose level usually?" (e.g., usual fasting morning value, usual evening value)     Not normally checked 4. KETONES: "Do you check for ketones (urine or blood test strips)?" If yes, ask: "What does the test show now?"      n/a 5. TYPE 1 or 2:  "Do you know what type of diabetes you have?"  (e.g., Type 1, Type 2, Gestational; doesn't know)      Possible prediabetic 6. INSULIN: "Do you take insulin?" "What type of insulin(s) do you use? What is the mode of delivery? (syringe, pen; injection or pump)?"      no 7. DIABETES PILLS: "Do you take any pills for your diabetes?" If yes, ask: "Have you missed taking any pills recently?"     no 8. OTHER SYMPTOMS: "Do you have any symptoms?" (e.g., fever, frequent urination, difficulty breathing, dizziness, weakness, vomiting)     Night sweats- not issue, abdominal pain- not as bad- still nauseous 9. PREGNANCY: "Is there any chance you are pregnant?" "When was  your last menstrual period?"     n/a  Protocols used: DIABETES - HIGH BLOOD SUGAR-A-AH

## 2019-05-16 NOTE — Telephone Encounter (Signed)
Pt is aware.  

## 2019-05-16 NOTE — Telephone Encounter (Signed)
Pt aware.

## 2019-05-17 ENCOUNTER — Ambulatory Visit
Admission: RE | Admit: 2019-05-17 | Discharge: 2019-05-17 | Disposition: A | Discharge status: Home / Self Care | Payer: Managed Care, Other (non HMO) | Source: Ambulatory Visit | Attending: Nurse Practitioner | Admitting: Nurse Practitioner

## 2020-02-11 ENCOUNTER — Telehealth: Payer: Self-pay | Admitting: Nurse Practitioner

## 2020-02-11 NOTE — Telephone Encounter (Signed)
Left message on voicemail to call office.  

## 2020-02-11 NOTE — Telephone Encounter (Signed)
Patient wife called and stated that patient backed into a nail and wanted to see if he needed to come in and get a tetanus shot. Wife stated that area has been cleaned, please advise. CB is [phone]

## 2020-02-12 ENCOUNTER — Encounter: Payer: Self-pay | Admitting: Nurse Practitioner

## 2020-02-12 ENCOUNTER — Ambulatory Visit: Payer: Managed Care, Other (non HMO)

## 2020-02-12 DIAGNOSIS — Z23 Encounter for immunization: Secondary | ICD-10-CM

## 2020-02-12 MED ORDER — TETANUS-DIPHTH-ACELL PERTUSSIS 5-2.5-18.5 LF-MCG/0.5 IM SUSP
0.5000 mL | Freq: Once | INTRAMUSCULAR | Status: AC
  Administered 2020-02-12: 0.5 mL via INTRAMUSCULAR

## 2020-02-12 NOTE — Progress Notes (Signed)
Pt came in an received a tetanus injection in his right deltoid, pt tolerated injection well, pt was given vis and told if any questions or concerns to give [place] a call.

## 2020-02-12 NOTE — Telephone Encounter (Signed)
Pt made a nurse visit per Baldo Ash to have a tetanus shot.

## 2020-03-17 ENCOUNTER — Other Ambulatory Visit: Payer: Self-pay

## 2020-03-17 ENCOUNTER — Ambulatory Visit (INDEPENDENT_AMBULATORY_CARE_PROVIDER_SITE_OTHER): Payer: Managed Care, Other (non HMO) | Admitting: Nurse Practitioner

## 2020-03-17 ENCOUNTER — Encounter: Payer: Self-pay | Admitting: Nurse Practitioner

## 2020-03-17 VITALS — BP 128/80 | HR 88 | Temp 97.6°F | Ht 71.0 in | Wt 221.0 lb

## 2020-03-17 DIAGNOSIS — Z0000 Encounter for general adult medical examination without abnormal findings: Secondary | ICD-10-CM

## 2020-03-17 NOTE — Patient Instructions (Signed)
Continue great job with exercise and healthy diet and small meal portions.  Schedule appt with dermatology, ophthalmology and dentist.  Preventive Care 27-27 Years Old, Male Preventive care refers to lifestyle choices and visits with your health care provider that can promote health and wellness. This includes:  A yearly physical exam. This is also called an annual well check.  Regular dental and eye exams.  Immunizations.  Screening for certain conditions.  Healthy lifestyle choices, such as eating a healthy diet, getting regular exercise, not using drugs or products that contain nicotine and tobacco, and limiting alcohol use. What can I expect for my preventive care visit? Physical exam Your health care provider will check:  Height and weight. These may be used to calculate body mass index (BMI), which is a measurement that tells if you are at a healthy weight.  Heart rate and blood pressure.  Your skin for abnormal spots. Counseling Your health care provider may ask you questions about:  Alcohol, tobacco, and drug use.  Emotional well-being.  Home and relationship well-being.  Sexual activity.  Eating habits.  Work and work Statistician. What immunizations do I need?  Influenza (flu) vaccine  This is recommended every year. Tetanus, diphtheria, and pertussis (Tdap) vaccine  You may need a Td booster every 10 years. Varicella (chickenpox) vaccine  You may need this vaccine if you have not already been vaccinated. Human papillomavirus (HPV) vaccine  If recommended by your health care provider, you may need three doses over 6 months. Measles, mumps, and rubella (MMR) vaccine  You may need at least one dose of MMR. You may also need a second dose. Meningococcal conjugate (MenACWY) vaccine  One dose is recommended if you are 55-65 years old and a Market researcher living in a residence hall, or if you have one of several medical conditions. You may also  need additional booster doses. Pneumococcal conjugate (PCV13) vaccine  You may need this if you have certain conditions and were not previously vaccinated. Pneumococcal polysaccharide (PPSV23) vaccine  You may need one or two doses if you smoke cigarettes or if you have certain conditions. Hepatitis A vaccine  You may need this if you have certain conditions or if you travel or work in places where you may be exposed to hepatitis A. Hepatitis B vaccine  You may need this if you have certain conditions or if you travel or work in places where you may be exposed to hepatitis B. Haemophilus influenzae type b (Hib) vaccine  You may need this if you have certain risk factors. You may receive vaccines as individual doses or as more than one vaccine together in one shot (combination vaccines). Talk with your health care provider about the risks and benefits of combination vaccines. What tests do I need? Blood tests  Lipid and cholesterol levels. These may be checked every 5 years starting at age 27.  Hepatitis C test.  Hepatitis B test. Screening   Diabetes screening. This is done by checking your blood sugar (glucose) after you have not eaten for a while (fasting).  Sexually transmitted disease (STD) testing. Talk with your health care provider about your test results, treatment options, and if necessary, the need for more tests. Follow these instructions at home: Eating and drinking   Eat a diet that includes fresh fruits and vegetables, whole grains, lean protein, and low-fat dairy products.  Take vitamin and mineral supplements as recommended by your health care provider.  Do not drink alcohol if your  health care provider tells you not to drink.  If you drink alcohol: ? Limit how much you have to 0-2 drinks a day. ? Be aware of how much alcohol is in your drink. In the [place] one drink equals one 12 oz bottle of beer (355 mL), one 5 oz glass of wine (148 mL), or one 1 oz  glass of hard liquor (44 mL). Lifestyle  Take daily care of your teeth and gums.  Stay active. Exercise for at least 30 minutes on 5 or more days each week.  Do not use any products that contain nicotine or tobacco, such as cigarettes, e-cigarettes, and chewing tobacco. If you need help quitting, ask your health care provider.  If you are sexually active, practice safe sex. Use a condom or other form of protection to prevent STIs (sexually transmitted infections). What's next?  Go to your health care provider once a year for a well check visit.  Ask your health care provider how often you should have your eyes and teeth checked.  Stay up to date on all vaccines. This information is not intended to replace advice given to you by your health care provider. Make sure you discuss any questions you have with your health care provider. Document Revised: 06/21/2018 Document Reviewed: 06/21/2018 Elsevier Patient Education  2020 [employer].

## 2020-03-17 NOTE — Progress Notes (Signed)
Subjective:    Patient ID: Ray Mclaughlin, male    DOB: [birth year], 29 y.o.   MRN: [id number]  Patient presents today for CPE   HPI  He chooses not to get any labs completed today, since he had normal Lipid panel and CMP.  Sexual History (orientation,birth control, marital status, STD):married, sexually active, declined need for STD screen  Depression/Suicide: Depression screen [facility] 2/9 03/17/2020 03/15/2019  Decreased Interest 0 0  Down, Depressed, Hopeless 0 0  PHQ - 2 Score 0 0   Vision:will schedule  Dental:will schedule  Immunizations: (TDAP, Hep C screen, Pneumovax, Influenza, zoster)  Health Maintenance  Topic Date Due  . COVID-19 Vaccine (1) Never done  . Flu Shot  02/09/2020  .  Hepatitis C: One time screening is recommended by Center for Disease Control  (CDC) for  adults born from [birth year] through 1963.   03/17/2021*  . HIV Screening  03/17/2021*  . Tetanus Vaccine  02/11/2030  *Topic was postponed. The date shown is not the original due date.   Diet:heart healthy Exercise: 3-4x/week, 1hr on [place] bike, weight training  Weight:  Wt Readings from Last 3 Encounters:  03/17/20 221 lb (100.2 kg)  05/14/19 216 lb (98 kg)  03/15/19 234 lb (106.1 kg)   Fall Risk: Fall Risk  03/15/2019  Falls in the past year? 0   Medications and allergies reviewed with patient and updated if appropriate.  Patient Active Problem List   Diagnosis Date Noted  . 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 Great toe pain, left 03/15/2019  . Class 1 obesity due to excess calories without serious comorbidity with body mass index (BMI) of 31.0 to 31.9 in adult 03/15/2019  . S/P orchiectomy 03/15/2019    Current Outpatient Medications on File Prior to Visit  Medication Sig Dispense Refill  . famotidine (PEPCID) 20 MG tablet Take 1 tablet (20 mg total) by mouth at bedtime. 7 tablet 0  . omeprazole (PRILOSEC) 20 MG capsule Take 1 capsule (20 mg total) by mouth daily before breakfast. (Patient not taking:  Reported on 03/17/2020) 7 capsule 0  . ondansetron (ZOFRAN) 4 MG tablet Take 1 tablet (4 mg total) by mouth every 8 (eight) hours as needed for nausea or vomiting. (Patient not taking: Reported on 03/17/2020) 20 tablet 0   No current facility-administered medications on file prior to visit.    History reviewed. No pertinent past medical history.  Past Surgical History:  Procedure Laterality Date  . ORCHIECTOMY Bilateral 07/21/2015   Procedure: SCROTAL EXPLORATION , RIGHT ORCHIIECTOMY /LEFT ORCHIOPEXY;  Surgeon: Carolan Clines, MD;  Location: [facility];  Service: Urology;  Laterality: Bilateral;  . TONSILLECTOMY  2001    Social History   Socioeconomic History  . Marital status: Single    Spouse name: Not on file  . Number of children: 0  . Years of education: Not on file  . Highest education level: Not on file  Occupational History    Comment: IT  Tobacco Use  . Smoking status: Never Smoker  . Smokeless tobacco: Never Used  Vaping Use  . Vaping Use: Some days  . Devices: only use it sometimes  Substance and Sexual Activity  . Alcohol use: No  . Drug use: No  . Sexual activity: Yes    Birth control/protection: None    Comment: fiancee with IUD  Other Topics Concern  . Not on file  Social History Narrative  . Not on file   Social Determinants of Health   Financial  Resource Strain:   . Difficulty of Paying Living Expenses: Not on file  Food Insecurity:   . Worried About Charity fundraiser in the Last Year: Not on file  . Ran Out of Food in the Last Year: Not on file  Transportation Needs:   . Lack of Transportation (Medical): Not on file  . Lack of Transportation (Non-Medical): Not on file  Physical Activity:   . Days of Exercise per Week: Not on file  . Minutes of Exercise per Session: Not on file  Stress:   . Feeling of Stress : Not on file  Social Connections:   . Frequency of Communication with Friends and Family: Not on file  . Frequency of Social Gatherings  with Friends and Family: Not on file  . Attends Religious Services: Not on file  . Active Member of Clubs or Organizations: Not on file  . Attends Archivist Meetings: Not on file  . Marital Status: Not on file   Family History  Adopted: Yes  Family history unknown: Yes       Review of Systems  Constitutional: Negative for fever, malaise/fatigue and weight loss.  HENT: Negative for congestion and sore throat.   Eyes:       Negative for visual changes  Respiratory: Negative for cough and shortness of breath.   Cardiovascular: Negative for chest pain, palpitations and leg swelling.  Gastrointestinal: Negative for blood in stool, constipation, diarrhea and heartburn.  Genitourinary: Negative for dysuria, frequency and urgency.  Musculoskeletal: Negative for falls, joint pain and myalgias.  Skin: Negative for rash.  Neurological: Negative for dizziness, sensory change and headaches.  Endo/Heme/Allergies: Does not bruise/bleed easily.  Psychiatric/Behavioral: Negative for depression, substance abuse and suicidal ideas. The patient is not nervous/anxious.    Objective:   Vitals:   03/17/20 0918  BP: 128/80  Pulse: 88  Temp: 97.6 F (36.4 C)  SpO2: 98%    Body mass index is 30.82 kg/m.   Physical Examination:  Physical Exam Vitals reviewed.  Constitutional:      General: He is not in acute distress.    Appearance: He is well-developed.  HENT:     Right Ear: Tympanic membrane, ear canal and external ear normal.     Left Ear: Tympanic membrane, ear canal and external ear normal.  Eyes:     Extraocular Movements: Extraocular movements intact.     Conjunctiva/sclera: Conjunctivae normal.  Cardiovascular:     Rate and Rhythm: Normal rate and regular rhythm.     Heart sounds: Normal heart sounds.  Pulmonary:     Effort: Pulmonary effort is normal. No respiratory distress.     Breath sounds: Normal breath sounds.  Chest:     Chest wall: No tenderness.    Abdominal:     General: Bowel sounds are normal.     Palpations: Abdomen is soft.  Musculoskeletal:        General: Normal range of motion.     Cervical back: Normal range of motion and neck supple.  Lymphadenopathy:     Cervical: No cervical adenopathy.  Skin:    General: Skin is warm and dry.  Neurological:     Mental Status: He is alert and oriented to person, place, and time.     Deep Tendon Reflexes: Reflexes are normal and symmetric.  Psychiatric:        Mood and Affect: Mood normal.        Behavior: Behavior normal.  Thought Content: Thought content normal.    ASSESSMENT and PLAN: This visit occurred during the SARS-CoV-2 public health emergency.  Safety protocols were in place, including screening questions prior to the visit, additional usage of staff PPE, and extensive cleaning of exam room while observing appropriate contact time as indicated for disinfecting solutions.   Ray Mclaughlin was seen today for annual exam.  Diagnoses and all orders for this visit:  Preventative health care  I have spent 31mins with this patient regarding history taking, documentation, education on pros and cons of COVID vaccine, types of COVID vaccines available, formulating plan and discussing treatment options with patient. Schedule appt with dermatology, ophthalmology and dentist.     Problem List Items Addressed This Visit    None    Visit Diagnoses    Preventative health care    -  Primary      Follow up: Return in about 1 year (around 03/17/2021) for CPE(fasting).  Wilfred Lacy, NP

## 2020-09-16 IMAGING — US US ABDOMEN LIMITED
1 series · 14 of 25 positions shown · non-contrast
Comparison: None

CLINICAL DATA: Upper abdominal pain and nausea with food intake for
1 week

EXAM:
ULTRASOUND ABDOMEN LIMITED RIGHT UPPER QUADRANT

[Series 1: us abdomen limited · 0.25mm/px · 14 of 37 slices shown]
[im 1/37]
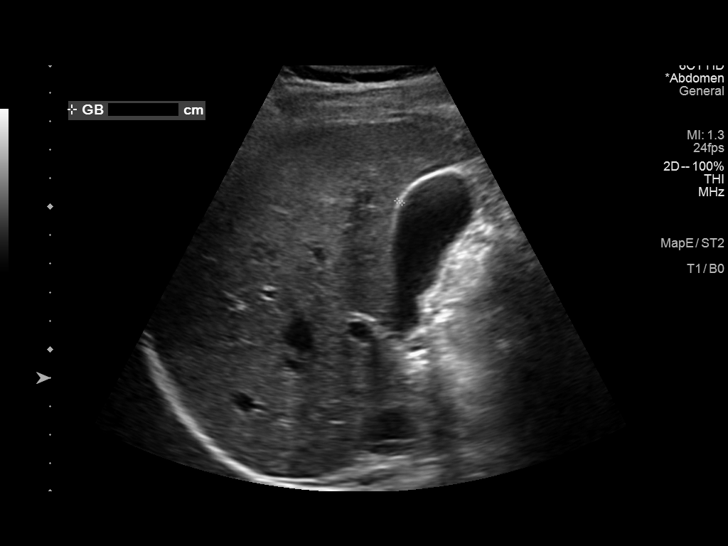
[im 4/37]
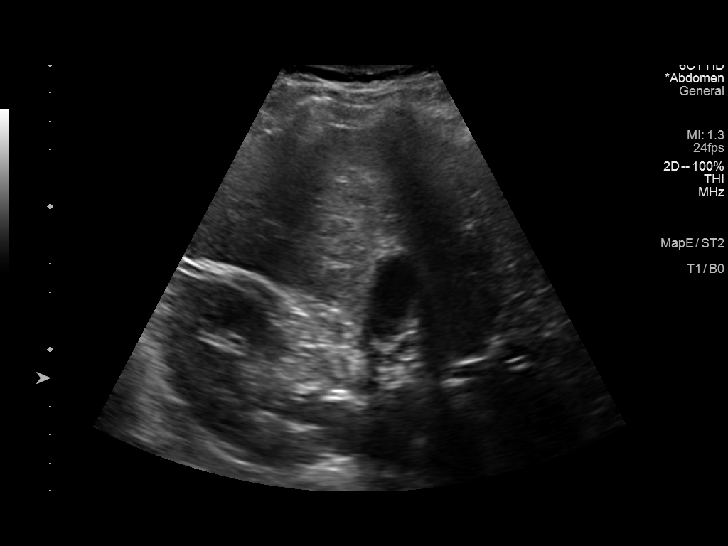
[im 7/37]
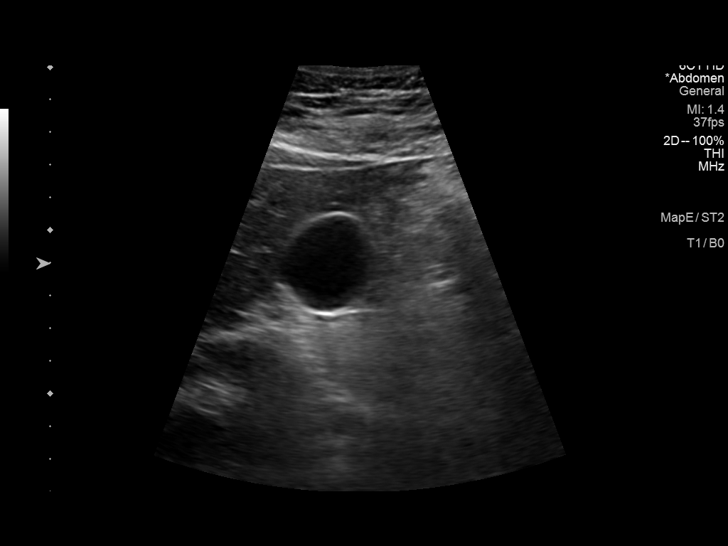
[im 10/37]
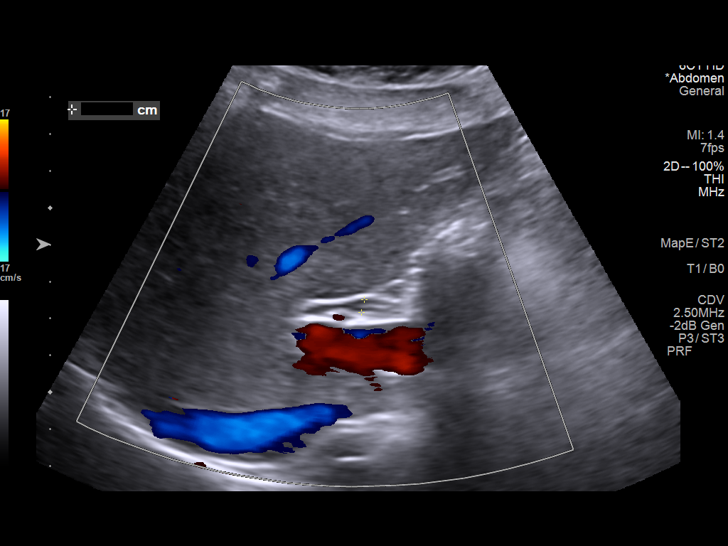
[im 13/37]
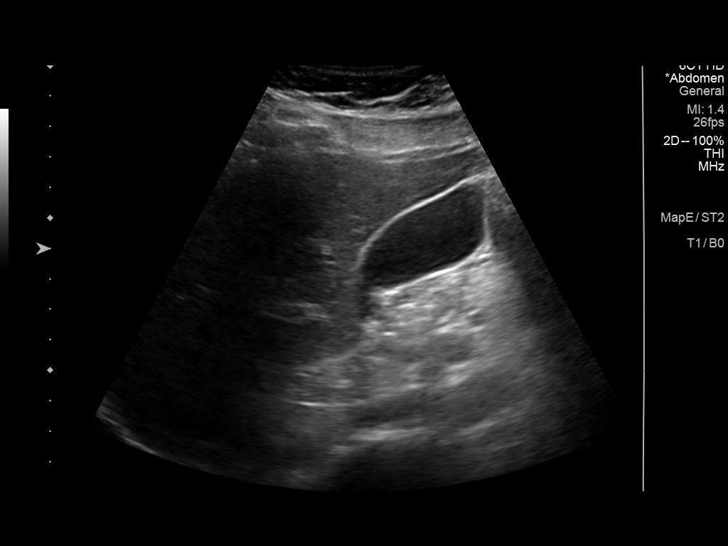
[im 14/37]
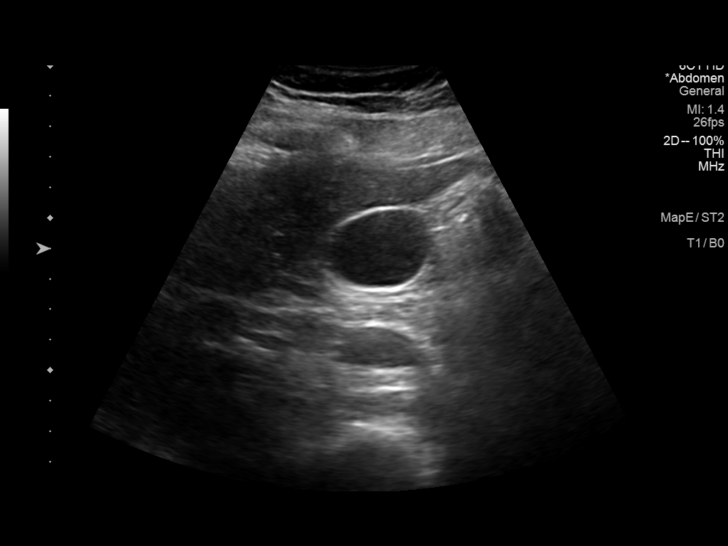
[im 17/37]
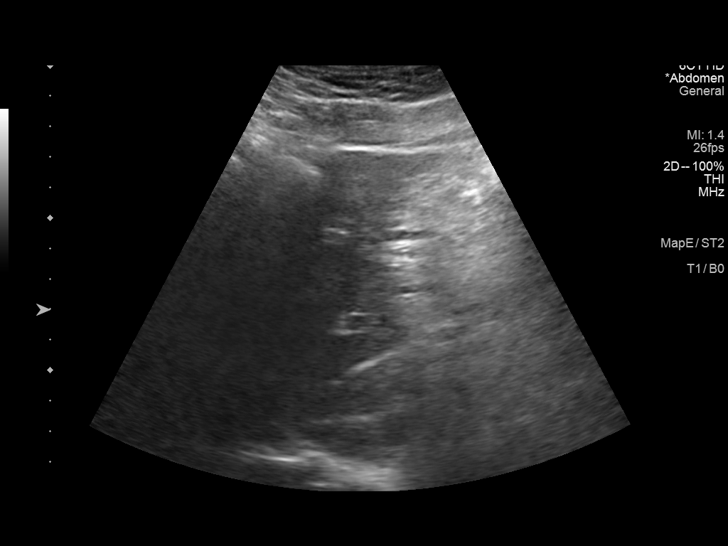
[im 20/37]
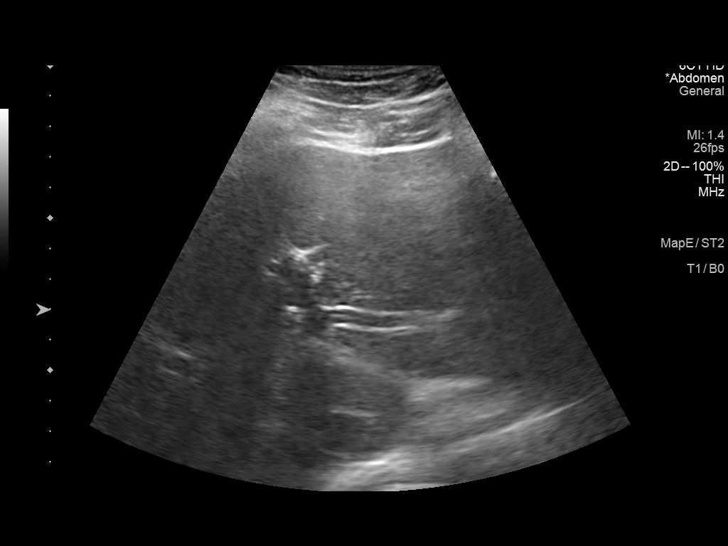
[im 23/37]
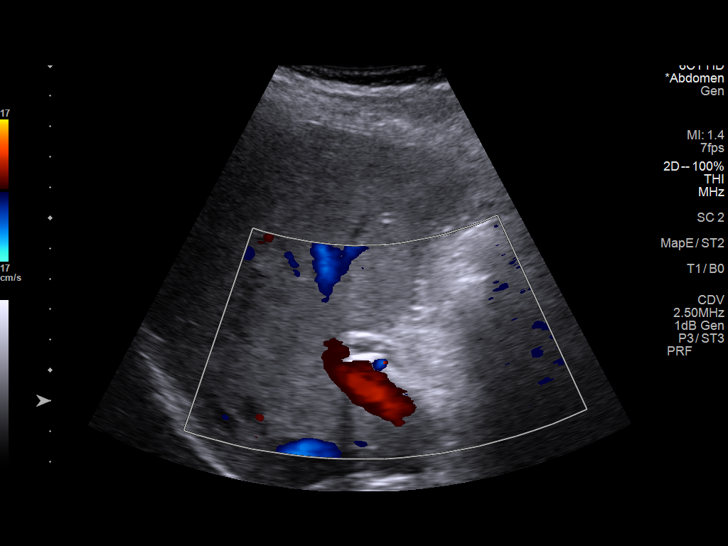
[im 25/37]
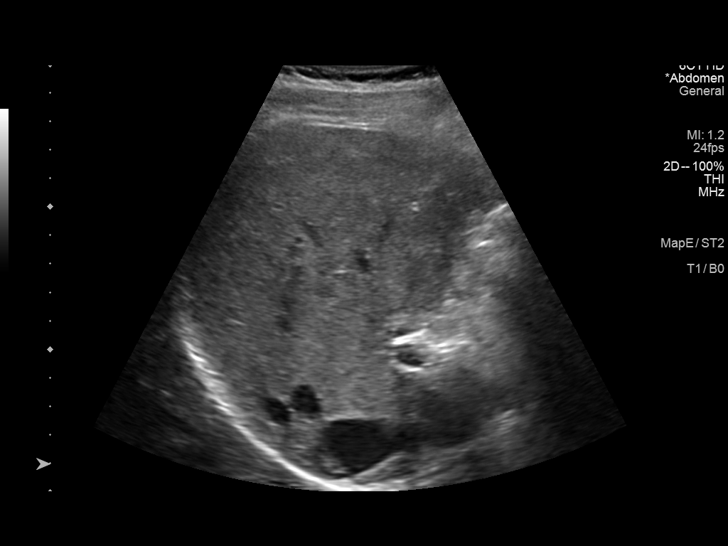
[im 28/37]
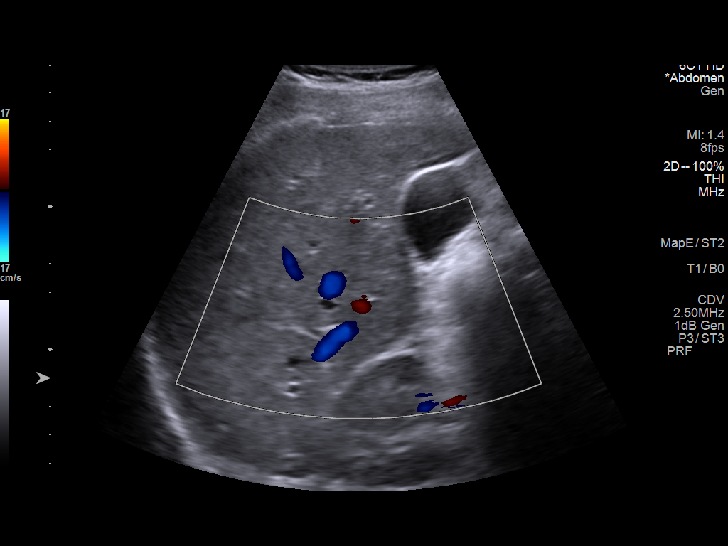
[im 31/37]
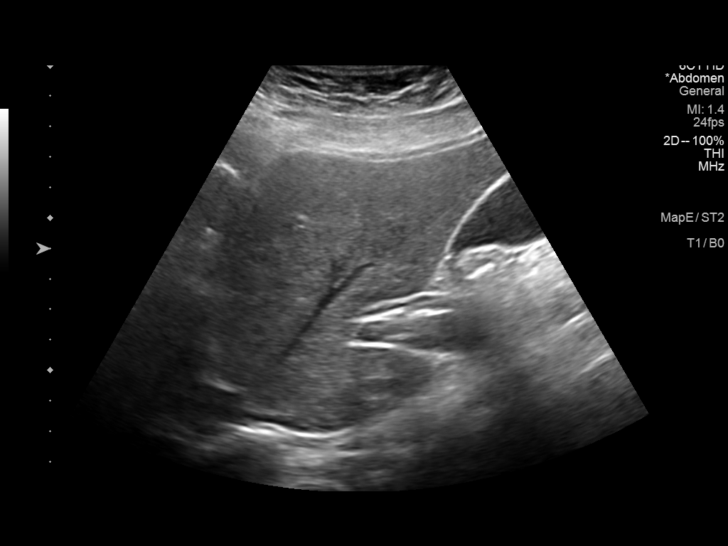
[im 34/37]
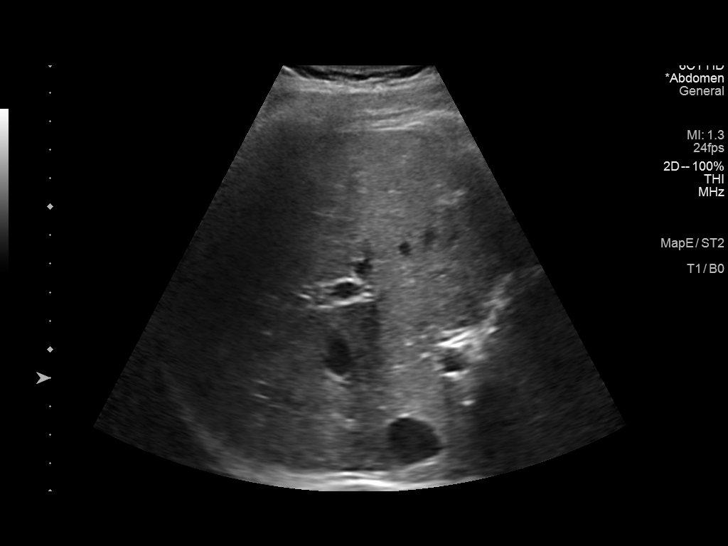
[im 37/37]
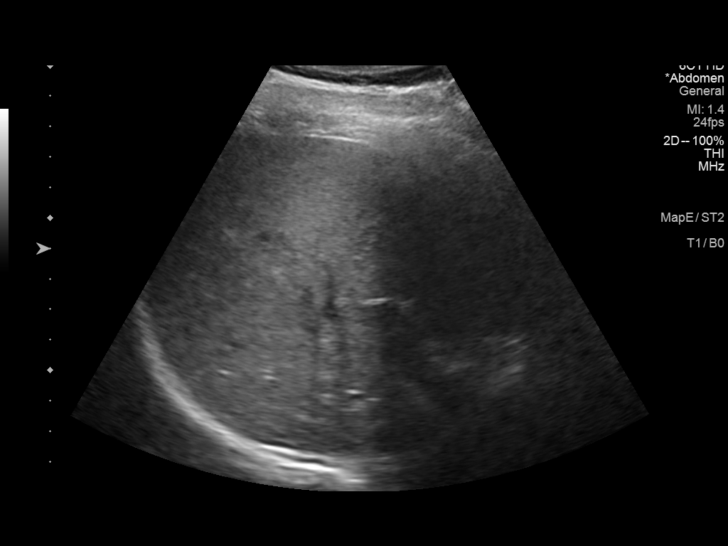

[14 of 25 positions shown; findings below may reference images not displayed]

FINDINGS: Gallbladder:

Normally distended without stones or wall thickening. No
pericholecystic fluid or sonographic Murphy sign.

Common bile duct:

Diameter: 4 mm diameter, normal

Liver:

Normal appearance. No mass or nodularity. Portal vein is patent on
color Doppler imaging with normal direction of blood flow towards
the liver.

Other: No RIGHT upper quadrant free fluid
IMPRESSION: Normal exam.

## 2020-11-11 ENCOUNTER — Telehealth: Payer: Self-pay | Admitting: Nurse Practitioner

## 2020-11-11 NOTE — Telephone Encounter (Signed)
Patient found a tick on him yesterday, unsure if he was actually bitten, he's not having any type of symptoms, but wanted to know if he should come in and be seen. Please call him at [phone] and advise.

## 2020-11-12 NOTE — Telephone Encounter (Signed)
Called to inform patient unless he is showing signs or symptoms ( fever, rash that is spreading, or muscle aches) he does not need an appointment. Unable to leave voicemail due to full mailbox.

## 2021-03-19 ENCOUNTER — Encounter: Payer: Managed Care, Other (non HMO) | Admitting: Nurse Practitioner

## 2021-03-19 ENCOUNTER — Other Ambulatory Visit: Payer: Self-pay

## 2021-03-31 ENCOUNTER — Ambulatory Visit (INDEPENDENT_AMBULATORY_CARE_PROVIDER_SITE_OTHER): Payer: Managed Care, Other (non HMO) | Admitting: Nurse Practitioner

## 2021-03-31 ENCOUNTER — Encounter: Payer: Self-pay | Admitting: Nurse Practitioner

## 2021-03-31 ENCOUNTER — Other Ambulatory Visit: Payer: Self-pay

## 2021-03-31 VITALS — BP 100/60 | HR 53 | Temp 98.0°F | Ht 69.0 in | Wt 210.2 lb

## 2021-03-31 DIAGNOSIS — D1800 Hemangioma unspecified site: Secondary | ICD-10-CM | POA: Insufficient documentation

## 2021-03-31 DIAGNOSIS — Z136 Encounter for screening for cardiovascular disorders: Secondary | ICD-10-CM | POA: Diagnosis not present

## 2021-03-31 DIAGNOSIS — Z6831 Body mass index (BMI) 31.0-31.9, adult: Secondary | ICD-10-CM

## 2021-03-31 DIAGNOSIS — E6609 Other obesity due to excess calories: Secondary | ICD-10-CM | POA: Diagnosis not present

## 2021-03-31 DIAGNOSIS — Z0001 Encounter for general adult medical examination with abnormal findings: Secondary | ICD-10-CM | POA: Diagnosis not present

## 2021-03-31 DIAGNOSIS — L729 Follicular cyst of the skin and subcutaneous tissue, unspecified: Secondary | ICD-10-CM | POA: Insufficient documentation

## 2021-03-31 DIAGNOSIS — Z13220 Encounter for screening for lipoid disorders: Secondary | ICD-10-CM

## 2021-03-31 LAB — COMPREHENSIVE METABOLIC PANEL
ALT: 23 U/L (ref 0–53)
AST: 17 U/L (ref 0–37)
Albumin: 4.4 g/dL (ref 3.5–5.2)
Alkaline Phosphatase: 66 U/L (ref 39–117)
BUN: 10 mg/dL (ref 6–23)
CO2: 30 mEq/L (ref 19–32)
Calcium: 9.6 mg/dL (ref 8.4–10.5)
Chloride: 102 mEq/L (ref 96–112)
Creatinine, Ser: 0.99 mg/dL (ref 0.40–1.50)
GFR: 103.83 mL/min (ref >60.00)
Glucose, Bld: 82 mg/dL (ref 70–99)
Potassium: 4.2 mEq/L (ref 3.5–5.1)
Sodium: 138 mEq/L (ref 135–145)
Total Bilirubin: 0.8 mg/dL (ref 0.2–1.2)
Total Protein: 7.1 g/dL (ref 6.0–8.3)

## 2021-03-31 LAB — CBC
HCT: 43.4 % (ref 39.0–52.0)
Hemoglobin: 14.6 g/dL (ref 13.0–17.0)
MCHC: 33.5 g/dL (ref 30.0–36.0)
MCV: 88.4 fl (ref 78.0–100.0)
Platelets: 192 10*3/uL (ref 150.0–400.0)
RBC: 4.91 Mil/uL (ref 4.22–5.81)
RDW: 14.1 % (ref 11.5–15.5)
WBC: 4.3 10*3/uL (ref 4.0–10.5)

## 2021-03-31 LAB — LIPID PANEL
Cholesterol: 155 mg/dL (ref 0–200)
HDL: 45.9 mg/dL (ref >39.00)
LDL Cholesterol: 96 mg/dL (ref 0–99)
NonHDL: 109.53
Total CHOL/HDL Ratio: 3
Triglycerides: 66 mg/dL (ref 0.0–149.0)
VLDL: 13.2 mg/dL (ref 0.0–40.0)

## 2021-03-31 NOTE — Assessment & Plan Note (Signed)
Lost 10pounds in last 1year Has incorporated heart healthy diet and daily exercise. Wt Readings from Last 3 Encounters:  03/31/21 210 lb 3.2 oz (95.3 kg)  03/17/20 221 lb (100.2 kg)  05/14/19 216 lb (98 kg)

## 2021-03-31 NOTE — Progress Notes (Signed)
Subjective:    Patient ID: Ray Mclaughlin, male    DOB: 1991/02/21, 30 y.o.   MRN: [id number]  Patient presents today for CPE  HPI  Vision:will schedule, corrective lens Dental:will schedule Diet:heart healthy Exercise:daily cardio Weight:  Wt Readings from Last 3 Encounters:  03/31/21 210 lb 3.2 oz (95.3 kg)  03/17/20 221 lb (100.2 kg)  05/14/19 216 lb (98 kg)    Sexual History (orientation,birth control, marital status, STD):male partner, no need for STD screen, no change in GU/GI function  Depression/Suicide: Depression screen [facility] 2/9 03/31/2021 03/17/2020 03/15/2019  Decreased Interest 0 0 0  Down, Depressed, Hopeless 0 0 0  PHQ - 2 Score 0 0 0  Altered sleeping 0 - -  Tired, decreased energy 0 - -  Change in appetite 0 - -  Feeling bad or failure about yourself  0 - -  Trouble concentrating 0 - -  Moving slowly or fidgety/restless 0 - -  Suicidal thoughts 0 - -  PHQ-9 Score 0 - -   Immunizations: (TDAP, Hep C screen, Pneumovax, Influenza, zoster)  Health Maintenance  Topic Date Due   HIV Screening  Never done   Hepatitis C Screening: USPSTF Recommendation to screen - Ages 24-79 yo.  Never done   COVID-19 Vaccine (1) 04/16/2021*   Flu Shot  06/09/2021*   Tetanus Vaccine  02/11/2030   HPV Vaccine  Aged Out  *Topic was postponed. The date shown is not the original due date.   Fall Risk: Fall Risk  03/31/2021 03/15/2019  Falls in the past year? 0 0  Number falls in past yr: 0 -  Injury with Fall? 0 -  Risk for fall due to : No Fall Risks -   Medications and allergies reviewed with patient and updated if appropriate.  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   No current facility-administered medications on file prior to visit.    History reviewed. No pertinent past medical history.  Past Surgical History:  Procedure Laterality Date   ORCHIECTOMY Bilateral 07/21/2015   Procedure: SCROTAL EXPLORATION , RIGHT ORCHIIECTOMY /LEFT ORCHIOPEXY;  Surgeon: Carolan Clines, MD;  Location: [facility];  Service: Urology;  Laterality: Bilateral;   TONSILLECTOMY  2001    Social History   Socioeconomic History   Marital status: Single    Spouse name: Not on file   Number of children: 0   Years of education: Not on file   Highest education level: Not on file  Occupational History    Comment: IT  Tobacco Use   Smoking status: Never   Smokeless tobacco: Never  Vaping Use   Vaping Use: Some days   Devices: only use it sometimes  Substance and Sexual Activity   Alcohol use: No   Drug use: No   Sexual activity: Yes    Birth control/protection: None    Comment: fiancee with IUD  Other Topics Concern   Not on file  Social History Narrative   Not on file   Social Determinants of Health   Financial Resource Strain: Not on file  Food Insecurity: Not on file  Transportation Needs:  Not on file  Physical Activity: Not on file  Stress: Not on file  Social Connections: Not on file   Family History  Adopted: Yes  Family history unknown: Yes       Review of Systems  Constitutional:  Negative for fever, malaise/fatigue and weight loss.  HENT:  Negative for congestion and sore throat.   Eyes:        Negative for visual changes  Respiratory:  Negative for cough and shortness of breath.   Cardiovascular:  Negative for chest pain, palpitations and leg swelling.  Gastrointestinal:  Negative for blood in stool, constipation, diarrhea and heartburn.  Genitourinary:  Negative for dysuria, frequency and urgency.  Musculoskeletal:  Negative for falls, joint pain and myalgias.  Skin:  Negative for rash.   Neurological:  Negative for dizziness, sensory change and headaches.  Endo/Heme/Allergies:  Does not bruise/bleed easily.  Psychiatric/Behavioral:  Negative for depression, substance abuse and suicidal ideas. The patient is not nervous/anxious and does not have insomnia.    Objective:   Vitals:   03/31/21 1128  BP: 100/60  Pulse: (!) 53  Temp: 98 F (36.7 C)  SpO2: 99%    Body mass index is 31.04 kg/m.   Physical Examination:  Physical Exam Vitals reviewed.  Constitutional:      General: He is not in acute distress.    Appearance: He is well-developed. He is obese.  HENT:     Right Ear: Tympanic membrane, ear canal and external ear normal.     Left Ear: Tympanic membrane, ear canal and external ear normal.  Eyes:     Extraocular Movements: Extraocular movements intact.     Conjunctiva/sclera: Conjunctivae normal.  Cardiovascular:     Rate and Rhythm: Normal rate and regular rhythm.     Pulses: Normal pulses.     Heart sounds: Normal heart sounds.  Pulmonary:     Effort: Pulmonary effort is normal. No respiratory distress.     Breath sounds: Normal breath sounds.  Chest:     Chest wall: No tenderness.  Abdominal:     General: Bowel sounds are normal.     Palpations: Abdomen is soft.  Musculoskeletal:        General: Normal range of motion.     Cervical back: Normal range of motion and neck supple.  Skin:    General: Skin is warm and dry.  Neurological:     Mental Status: He is alert and oriented to person, place, and time.     Deep Tendon Reflexes: Reflexes are normal and symmetric.  Psychiatric:        Mood and Affect: Mood normal.        Behavior: Behavior normal.        Thought Content: Thought content normal.   ASSESSMENT and PLAN: This visit occurred during the SARS-CoV-2 public health emergency.  Safety protocols were in place, including screening questions prior to the visit, additional usage of staff PPE, and extensive cleaning of exam room while  observing appropriate contact time as indicated for disinfecting solutions.   Ray Mclaughlin was seen today for annual exam.  Diagnoses and all orders for this visit:  Encounter for preventative adult health care exam with abnormal findings -     Comprehensive metabolic panel -     Cancel: CBC with Differential/Platelet -     Lipid panel -     CBC  Encounter for lipid screening for cardiovascular disease -     Lipid panel  Class 1  obesity due to excess calories without serious comorbidity with body mass index (BMI) of 31.0 to 31.9 in adult     Problem List Items Addressed This Visit       Other   Class 1 obesity due to excess calories without serious comorbidity with body mass index (BMI) of 31.0 to 31.9 in adult    Lost 10pounds in last 1year Has incorporated heart healthy diet and daily exercise. Wt Readings from Last 3 Encounters:  03/31/21 210 lb 3.2 oz (95.3 kg)  03/17/20 221 lb (100.2 kg)  05/14/19 216 lb (98 kg)        Other Visit Diagnoses     Encounter for preventative adult health care exam with abnormal findings    -  Primary   Relevant Orders   Comprehensive metabolic panel   Lipid panel   CBC   Encounter for lipid screening for cardiovascular disease       Relevant Orders   Lipid panel       Follow up: Return in about 1 year (around 03/31/2022) for CPE (fasting).  Wilfred Lacy, NP

## 2021-03-31 NOTE — Patient Instructions (Signed)
Wt Readings from Last 3 Encounters:  03/31/21 210 lb 3.2 oz (95.3 kg)  03/17/20 221 lb (100.2 kg)  05/14/19 216 lb (98 kg)    Go to lab for blood draw  Schedule eye exam and dental cleaning  Keep up the good work with diet and exercise  Preventive Care 49-28 Years Old, Male Preventive care refers to lifestyle choices and visits with your health care provider that can promote health and wellness. This includes: A yearly physical exam. This is also called an annual wellness visit. Regular dental and eye exams. Immunizations. Screening for certain conditions. Healthy lifestyle choices, such as: Eating a healthy diet. Getting regular exercise. Not using drugs or products that contain nicotine and tobacco. Limiting alcohol use. What can I expect for my preventive care visit? Physical exam Your health care provider may check your: Height and weight. These may be used to calculate your BMI (body mass index). BMI is a measurement that tells if you are at a healthy weight. Heart rate and blood pressure. Body temperature. Skin for abnormal spots. Counseling Your health care provider may ask you questions about your: Past medical problems. Family's medical history. Alcohol, tobacco, and drug use. Emotional well-being. Home life and relationship well-being. Sexual activity. Diet, exercise, and sleep habits. Work and work Statistician. Access to firearms. What immunizations do I need? Vaccines are usually given at various ages, according to a schedule. Your health care provider will recommend vaccines for you based on your age, medical history, and lifestyle or other factors, such as travel or where you work. What tests do I need? Blood tests Lipid and cholesterol levels. These may be checked every 5 years starting at age 71. Hepatitis C test. Hepatitis B test. Screening  Diabetes screening. This is done by checking your blood sugar (glucose) after you have not eaten for a while  (fasting). Genital exam to check for testicular cancer or hernias. STD (sexually transmitted disease) testing, if you are at risk. Talk with your health care provider about your test results, treatment options, and if necessary, the need for more tests. Follow these instructions at home: Eating and drinking  Eat a healthy diet that includes fresh fruits and vegetables, whole grains, lean protein, and low-fat dairy products. Drink enough fluid to keep your urine pale yellow. Take vitamin and mineral supplements as recommended by your health care provider. Do not drink alcohol if your health care provider tells you not to drink. If you drink alcohol: Limit how much you have to 0-2 drinks a day. Be aware of how much alcohol is in your drink. In the [place] one drink equals one 12 oz bottle of beer (355 mL), one 5 oz glass of wine (148 mL), or one 1 oz glass of hard liquor (44 mL). Lifestyle Take daily care of your teeth and gums. Brush your teeth every morning and night with fluoride toothpaste. Floss one time each day. Stay active. Exercise for at least 30 minutes 5 or more days each week. Do not use any products that contain nicotine or tobacco, such as cigarettes, e-cigarettes, and chewing tobacco. If you need help quitting, ask your health care provider. Do not use drugs. If you are sexually active, practice safe sex. Use a condom or other form of protection to prevent STIs (sexually transmitted infections). Find healthy ways to cope with stress, such as: Meditation, yoga, or listening to music. Journaling. Talking to a trusted person. Spending time with friends and family. Safety Always wear your seat  belt while driving or riding in a vehicle. Do not drive: If you have been drinking alcohol. Do not ride with someone who has been drinking. When you are tired or distracted. While texting. Wear a helmet and other protective equipment during sports activities. If you have firearms in  your house, make sure you follow all gun safety procedures. Seek help if you have been physically or sexually abused. What's next? Go to your health care provider once a year for an annual wellness visit. Ask your health care provider how often you should have your eyes and teeth checked. Stay up to date on all vaccines. This information is not intended to replace advice given to you by your health care provider. Make sure you discuss any questions you have with your health care provider. Document Revised: 09/04/2020 Document Reviewed: 06/21/2018 Elsevier Patient Education  2022 [employer].

## 2022-03-25 ENCOUNTER — Encounter: Payer: Managed Care, Other (non HMO) | Admitting: Nurse Practitioner

## 2022-08-19 ENCOUNTER — Ambulatory Visit
Admission: EM | Admit: 2022-08-19 | Discharge: 2022-08-19 | Disposition: A | Discharge status: Home / Self Care | Payer: Managed Care, Other (non HMO) | Attending: Nurse Practitioner | Admitting: Nurse Practitioner

## 2022-08-19 DIAGNOSIS — J029 Acute pharyngitis, unspecified: Secondary | ICD-10-CM

## 2022-08-19 DIAGNOSIS — Z20818 Contact with and (suspected) exposure to other bacterial communicable diseases: Secondary | ICD-10-CM | POA: Diagnosis present

## 2022-08-19 DIAGNOSIS — R509 Fever, unspecified: Secondary | ICD-10-CM | POA: Diagnosis present

## 2022-08-19 DIAGNOSIS — B349 Viral infection, unspecified: Secondary | ICD-10-CM | POA: Diagnosis present

## 2022-08-19 LAB — POCT INFLUENZA A/B
Influenza A, POC: NEGATIVE
Influenza B, POC: NEGATIVE

## 2022-08-19 LAB — POCT RAPID STREP A (OFFICE): Rapid Strep A Screen: NEGATIVE

## 2022-08-19 NOTE — ED Triage Notes (Signed)
Pt states he was exposed to strep, and yesterday he began having a fever (101F) sore throat, cough and soreness to his neck.   Home interventions: tylenol, motrin

## 2022-08-19 NOTE — ED Provider Notes (Signed)
[facility]    CSN: [id number] Arrival date & time: 08/19/22  0800      History   Chief Complaint Chief Complaint  Patient presents with   Sore Throat   Cough   Fever    HPI Ray Mclaughlin is a 32 y.o. male  who presents for evaluation of URI symptoms for 1 days. Patient reports associated symptoms of sore throat, fever of 101 degrees, cough, congestion, and sore neck. Denies N/V/D, ear pain, body aches, shortness of breath. Patient does not have a hx of asthma or smoking.  Wife had strep throat.  No recent travel. Pt has taken Tylenol and ibuprofen OTC for symptoms. Pt has no other concerns at this time.    Sore Throat  Cough Associated symptoms: fever and sore throat   Fever Associated symptoms: congestion, cough and sore throat     History reviewed. No pertinent past medical history.  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/15/2019    Past Surgical History:  Procedure Laterality Date   ORCHIECTOMY Bilateral 07/21/2015   Procedure: SCROTAL EXPLORATION , RIGHT ORCHIIECTOMY /LEFT ORCHIOPEXY;  Surgeon: Carolan Clines, MD;  Location: [facility];  Service: Urology;  Laterality: Bilateral;   TONSILLECTOMY  2001       Home Medications    Prior to Admission medications   Not on File    Family History Family History  Adopted: Yes  Family history unknown: Yes    Social History Social History   Tobacco Use   Smoking status: Never   Smokeless tobacco: Never  Vaping Use   Vaping Use: Some days   Devices: only use it sometimes  Substance Use Topics   Alcohol use: No   Drug use: No     Allergies   Apple juice, Carrot [daucus carota], and Peanuts [peanut oil]   Review  of Systems Review of Systems  Constitutional:  Positive for fever.  HENT:  Positive for congestion and sore throat.   Respiratory:  Positive for cough.   Musculoskeletal:  Positive for neck pain.     Physical Exam Triage Vital Signs ED Triage Vitals  Enc Vitals Group     BP 08/19/22 0816 121/77     Pulse Rate 08/19/22 0816 96     Resp 08/19/22 0816 18     Temp 08/19/22 0816 (!) 97.5 F (36.4 C)     Temp Source 08/19/22 0816 Oral     SpO2 08/19/22 0816 96 %     Weight --      Height --      Head Circumference --      Peak Flow --      Pain Score 08/19/22 0815 4     Pain Loc --      Pain Edu? --      Excl. in [place]? --    No data found.  Updated Vital Signs BP 121/77 (BP Location: Left Arm)   Pulse 96   Temp (!) 97.5 F (36.4 C) (Oral)   Resp 18   SpO2 96%   Visual Acuity Right Eye Distance:   Left Eye Distance:   Bilateral Distance:    Right Eye Near:   Left  Eye Near:    Bilateral Near:     Physical Exam Vitals and nursing note reviewed.  Constitutional:      General: He is not in acute distress.    Appearance: Normal appearance. He is not ill-appearing or toxic-appearing.  HENT:     Head: Normocephalic and atraumatic.     Right Ear: Tympanic membrane and ear canal normal.     Left Ear: Tympanic membrane and ear canal normal.     Nose: Congestion present.     Mouth/Throat:     Mouth: Mucous membranes are moist.     Pharynx: Posterior oropharyngeal erythema present.  Eyes:     Pupils: Pupils are equal, round, and reactive to light.  Neck:     Comments: Mild para cervical spinal muscle tenderness to palpation.  No spinal tenderness with palpation. Cardiovascular:     Rate and Rhythm: Normal rate and regular rhythm.     Heart sounds: Normal heart sounds.  Pulmonary:     Effort: Pulmonary effort is normal.     Breath sounds: Normal breath sounds.  Musculoskeletal:     Cervical back: Normal range of motion and neck supple. No rigidity.  Lymphadenopathy:      Cervical: No cervical adenopathy.  Skin:    General: Skin is warm and dry.  Neurological:     General: No focal deficit present.     Mental Status: He is alert and oriented to person, place, and time.  Psychiatric:        Mood and Affect: Mood normal.        Behavior: Behavior normal.      UC Treatments / Results  Labs (all labs ordered are listed, but only abnormal results are displayed) Labs Reviewed  CULTURE, GROUP A STREP [facility])  POCT RAPID STREP A (OFFICE)  POCT INFLUENZA A/B    EKG   Radiology No results found.  Procedures Procedures (including critical care time)  Medications Ordered in UC Medications - No data to display  Initial Impression / Assessment and Plan / UC Course  I have reviewed the triage vital signs and the nursing notes.  Pertinent labs & imaging results that were available during my care of the patient were reviewed by me and considered in my medical decision making (see chart for details).     Negative rapid strep in clinic, will culture given known exposure Negative rapid flu in clinic Discussed viral illness and symptomatic treatment Continue over-the-counter ibuprofen or Tylenol as needed Rest and fluids PCP follow-up 2 to 3 days for recheck ER precautions reviewed and patient verbalized understanding Final Clinical Impressions(s) / UC Diagnoses   Final diagnoses:  Sore throat  Fever, unspecified  Exposure to strep throat  Viral illness     Discharge Instructions      Your symptoms and exam are consistent for a viral illness. Please treat your symptoms with over the counter cough medication, tylenol or ibuprofen, humidifier, and rest. Viral illnesses can last 7-14 days. Please follow up with your PCP if your symptoms are not improving. Please go to the ER for any worsening symptoms. This includes but is not limited to fever you can not control with tylenol or ibuprofen, you are not able to stay hydrated, you have shortness  of breath or chest pain.  Thank you for choosing [facility] for your healthcare needs. I hope you feel better soon!      ED Prescriptions   None    PDMP not reviewed this  encounter.   Melynda Ripple, NP 08/19/22 [phone]

## 2022-08-19 NOTE — Discharge Instructions (Addendum)
Your symptoms and exam are consistent for a viral illness. Please treat your symptoms with over the counter cough medication, tylenol or ibuprofen, humidifier, and rest. Viral illnesses can last 7-14 days. Please follow up with your PCP if your symptoms are not improving. Please go to the ER for any worsening symptoms. This includes but is not limited to fever you can not control with tylenol or ibuprofen, you are not able to stay hydrated, you have shortness of breath or chest pain.  Thank you for choosing [facility] for your healthcare needs. I hope you feel better soon!

## 2022-08-20 LAB — CULTURE, GROUP A STREP (THRC)

## 2022-08-21 LAB — CULTURE, GROUP A STREP (THRC)

## 2022-08-22 LAB — CULTURE, GROUP A STREP (THRC)

## 2022-08-28 ENCOUNTER — Other Ambulatory Visit: Payer: Self-pay

## 2022-08-28 ENCOUNTER — Emergency Department (HOSPITAL_BASED_OUTPATIENT_CLINIC_OR_DEPARTMENT_OTHER): Payer: Managed Care, Other (non HMO)

## 2022-08-28 ENCOUNTER — Encounter (HOSPITAL_BASED_OUTPATIENT_CLINIC_OR_DEPARTMENT_OTHER): Payer: Self-pay | Admitting: Emergency Medicine

## 2022-08-28 ENCOUNTER — Emergency Department (HOSPITAL_BASED_OUTPATIENT_CLINIC_OR_DEPARTMENT_OTHER)
Admission: EM | Admit: 2022-08-28 | Discharge: 2022-08-28 | Disposition: A | Discharge status: Home / Self Care | Payer: Managed Care, Other (non HMO) | Attending: Emergency Medicine | Admitting: Emergency Medicine

## 2022-08-28 DIAGNOSIS — K529 Noninfective gastroenteritis and colitis, unspecified: Secondary | ICD-10-CM

## 2022-08-28 DIAGNOSIS — Z91010 Allergy to peanuts: Secondary | ICD-10-CM | POA: Diagnosis not present

## 2022-08-28 DIAGNOSIS — R109 Unspecified abdominal pain: Secondary | ICD-10-CM | POA: Diagnosis present

## 2022-08-28 LAB — COMPREHENSIVE METABOLIC PANEL
ALT: 26 U/L (ref 0–44)
AST: 19 U/L (ref 15–41)
Albumin: 4.2 g/dL (ref 3.5–5.0)
Alkaline Phosphatase: 71 U/L (ref 38–126)
Anion gap: 8 (ref 5–15)
BUN: 14 mg/dL (ref 6–20)
CO2: 21 mmol/L — ABNORMAL LOW (ref 22–32)
Calcium: 8.7 mg/dL — ABNORMAL LOW (ref 8.9–10.3)
Chloride: 103 mmol/L (ref 98–111)
Creatinine, Ser: 0.82 mg/dL (ref 0.61–1.24)
GFR, Estimated: >60 mL/min (ref >60)
Glucose, Bld: 113 mg/dL — ABNORMAL HIGH (ref 70–99)
Potassium: 3.6 mmol/L (ref 3.5–5.1)
Sodium: 132 mmol/L — ABNORMAL LOW (ref 135–145)
Total Bilirubin: 1.2 mg/dL (ref 0.3–1.2)
Total Protein: 8.1 g/dL (ref 6.5–8.1)

## 2022-08-28 LAB — CBC
HCT: 43.3 % (ref 39.0–52.0)
Hemoglobin: 15.3 g/dL (ref 13.0–17.0)
MCH: 29.8 pg (ref 26.0–34.0)
MCHC: 35.3 g/dL (ref 30.0–36.0)
MCV: 84.4 fL (ref 80.0–100.0)
Platelets: 251 10*3/uL (ref 150–400)
RBC: 5.13 MIL/uL (ref 4.22–5.81)
RDW: 13.6 % (ref 11.5–15.5)
WBC: 17.1 10*3/uL — ABNORMAL HIGH (ref 4.0–10.5)
nRBC: 0 % (ref 0.0–0.2)

## 2022-08-28 LAB — URINALYSIS, ROUTINE W REFLEX MICROSCOPIC
Bilirubin Urine: NEGATIVE
Glucose, UA: NEGATIVE mg/dL
Hgb urine dipstick: NEGATIVE
Ketones, ur: 40 mg/dL — AB
Leukocytes,Ua: NEGATIVE
Nitrite: NEGATIVE
Protein, ur: NEGATIVE mg/dL
Specific Gravity, Urine: 1.025 (ref 1.005–1.030)
pH: 6 (ref 5.0–8.0)

## 2022-08-28 LAB — LIPASE, BLOOD: Lipase: 23 U/L (ref 11–51)

## 2022-08-28 MED ORDER — SODIUM CHLORIDE 0.9 % IV BOLUS
1000.0000 mL | Freq: Once | INTRAVENOUS | Status: AC
  Administered 2022-08-28: 1000 mL via INTRAVENOUS

## 2022-08-28 MED ORDER — ACETAMINOPHEN 325 MG PO TABS: 650.0000 mg | ORAL_TABLET | Freq: Once | ORAL | Status: DC

## 2022-08-28 MED ORDER — ONDANSETRON HCL 4 MG/2ML IJ SOLN
4.0000 mg | Freq: Once | INTRAMUSCULAR | Status: AC
  Administered 2022-08-28: 4 mg via INTRAVENOUS
  Filled 2022-08-28: qty 2

## 2022-08-28 MED ORDER — FAMOTIDINE IN NACL 20-0.9 MG/50ML-% IV SOLN
20.0000 mg | Freq: Once | INTRAVENOUS | Status: AC
  Administered 2022-08-28: 20 mg via INTRAVENOUS
  Filled 2022-08-28: qty 50

## 2022-08-28 MED ORDER — IOHEXOL 300 MG/ML  SOLN
100.0000 mL | Freq: Once | INTRAMUSCULAR | Status: AC | PRN
  Administered 2022-08-28: 100 mL via INTRAVENOUS

## 2022-08-28 MED ORDER — ONDANSETRON 4 MG PO TBDP: ORAL_TABLET | ORAL | 0 refills | Status: AC

## 2022-08-28 NOTE — Discharge Instructions (Signed)
You likely have a stomach virus.  As we discussed, your CT abdomen pelvis did not show any appendicitis or any problems with your gallbladder.  Take Zofran for nausea and stay hydrated.  Expect to have diarrhea and you may take Imodium up to 10 times a day for diarrhea  See your doctor for follow-up  Return to ER if you have worse abdominal pain or vomiting or fever or dehydration.

## 2022-08-28 NOTE — ED Provider Notes (Signed)
[facility] [place] [facility] Provider Note   CSN: [id number] Arrival date & time: 08/28/22  1933     History  Chief Complaint  Patient presents with   Abdominal Pain    Ray Mclaughlin is a 32 y.o. male presenting with abdominal pain and vomiting.  Patient states that he went out and ate some greasy dinner yesterday.  He states that he woke up this morning had some nausea and felt acid reflux sensation.  He also then started vomiting greenish material.  Patient also has some loose stool as well.  Has some chills but no fever.  Patient is otherwise healthy.  The history is provided by the patient.       Home Medications Prior to Admission medications   Not on File      Allergies    Apple juice, Carrot [daucus carota], and Peanuts [peanut oil]    Review of Systems   Review of Systems  Gastrointestinal:  Positive for abdominal pain and vomiting.  All other systems reviewed and are negative.   Physical Exam Updated Vital Signs BP (!) 122/57   Pulse 83   Temp 98 F (36.7 C) (Oral)   Resp 17   SpO2 100%  Physical Exam Vitals and nursing note reviewed.  Constitutional:      Comments: Uncomfortable and dehydrated  HENT:     Head: Normocephalic.     Mouth/Throat:     Pharynx: Oropharynx is clear.  Eyes:     Extraocular Movements: Extraocular movements intact.     Pupils: Pupils are equal, round, and reactive to light.  Cardiovascular:     Rate and Rhythm: Normal rate and regular rhythm.  Pulmonary:     Effort: Pulmonary effort is normal.     Breath sounds: Normal breath sounds.  Abdominal:     Comments: + Mild epigastric tenderness  Skin:    General: Skin is warm.     Capillary Refill: Capillary refill takes less than 2 seconds.  Neurological:     General: No focal deficit present.     Mental Status: He is oriented to person, place, and time.  Psychiatric:        Mood and Affect: Mood normal.        Behavior: Behavior normal.      ED Results / Procedures / Treatments   Labs (all labs ordered are listed, but only abnormal results are displayed) Labs Reviewed  COMPREHENSIVE METABOLIC PANEL - Abnormal; Notable for the following components:      Result Value   Sodium 132 (*)    CO2 21 (*)    Glucose, Bld 113 (*)    Calcium 8.7 (*)    All other components within normal limits  CBC - Abnormal; Notable for the following components:   WBC 17.1 (*)    All other components within normal limits  URINALYSIS, ROUTINE W REFLEX MICROSCOPIC - Abnormal; Notable for the following components:   Ketones, ur 40 (*)    All other components within normal limits  LIPASE, BLOOD    EKG None  Radiology CT ABDOMEN PELVIS W CONTRAST  Result Date: 08/28/2022 CLINICAL DATA:  Abdominal pain. EXAM: CT ABDOMEN AND PELVIS WITH CONTRAST TECHNIQUE: Multidetector CT imaging of the abdomen and pelvis was performed using the standard protocol following bolus administration of intravenous contrast. RADIATION DOSE REDUCTION: This exam was performed according to the departmental dose-optimization program which includes automated exposure control, adjustment of the mA and/or kV according to  patient size and/or use of iterative reconstruction technique. CONTRAST:  123m OMNIPAQUE IOHEXOL 300 MG/ML  SOLN COMPARISON:  None Available. FINDINGS: Lower chest: The visualized lung bases are clear. No intra-abdominal free air or free fluid. Hepatobiliary: No focal liver abnormality is seen. No gallstones, gallbladder wall thickening, or biliary dilatation. Pancreas: Unremarkable. No pancreatic ductal dilatation or surrounding inflammatory changes. Spleen: Normal in size without focal abnormality. Adrenals/Urinary Tract: The adrenal glands, kidneys, visualized ureters, and urinary bladder appear unremarkable. Stomach/Bowel: There is loose stool throughout the colon for indicative of diarrheal state. Correlation with clinical exam and stool cultures recommended.  Normal caliber fluid-filled loops of small bowel mid to lower abdomen may be physiologic or represent mild enteritis. Thickened appearance of the ascending colon likely related to underdistention. Colitis is less likely. There is no bowel obstruction. The appendix is normal. Vascular/Lymphatic: The abdominal aorta and IVC unremarkable. No portal venous gas. There is no adenopathy. Reproductive: The prostate and seminal vesicles are grossly unremarkable. The mass. Other: None Musculoskeletal: No acute or significant osseous findings. IMPRESSION: 1. Diarrheal state with possible mild enteritis. Correlation with clinical exam and stool cultures recommended. 2. No bowel obstruction.  Normal appendix. Electronically Signed   By: AAnner CreteM.D.   On: 08/28/2022 22:53    Procedures Procedures    Medications Ordered in ED Medications  sodium chloride 0.9 % bolus 1,000 mL (1,000 mLs Intravenous New Bag/Given 08/28/22 2203)  ondansetron (ZOFRAN) injection 4 mg (4 mg Intravenous Given 08/28/22 2210)  famotidine (PEPCID) IVPB 20 mg premix (20 mg Intravenous New Bag/Given 08/28/22 2208)  iohexol (OMNIPAQUE) 300 MG/ML solution 100 mL (100 mLs Intravenous Contrast Given 08/28/22 2219)    ED Course/ Medical Decision Making/ A&P                             Medical Decision Making Ray Mclaughlin a 32y.o. male here presenting with abdominal pain and vomiting.  I think likely gastritis versus pancreatitis versus gastroenteritis.  I have low suspicion for appendicitis.  Plan to get CBC and CMP and lipase and CT abdomen pelvis and urinalysis.  11:14 PM Reviewed patient's labs and independently interpreted imaging study.  Patient's white blood cell count is 17.  UA showed ketones but no UTI.  CT abdomen pelvis showed enteritis but no appendicitis and normal gallbladder.  I think at this point patient has gastroenteritis.  Patient stable for discharge  Problems Addressed: Gastroenteritis: acute illness or  injury  Amount and/or Complexity of Data Reviewed Labs: ordered. Decision-making details documented in ED Course. Radiology: ordered and independent interpretation performed. Decision-making details documented in ED Course.  Risk Prescription drug management.    Final Clinical Impression(s) / ED Diagnoses Final diagnoses:  None    Rx / DC Orders ED Discharge Orders     None         YDrenda Freeze MD 08/28/22 2315

## 2022-08-28 NOTE — ED Notes (Signed)
Pt states he felt lightheaded after IV was placed, pt became pale and cool. BP 89/54, Pt's IV fluids infusing, Yao MD made aware.

## 2022-08-28 NOTE — ED Triage Notes (Signed)
Patient here with abdominal discomfort, states that he ate something greasy last night for dinner, had acid reflux and today having a lot of discomfort like gas moving around his abdomen.  Patient with nausea and vomiting this morning and this afternoon, vomiting bile.

## 2023-05-18 ENCOUNTER — Ambulatory Visit: Payer: Self-pay

## 2023-05-20 ENCOUNTER — Ambulatory Visit: Payer: Managed Care, Other (non HMO)

## 2023-05-20 ENCOUNTER — Ambulatory Visit
Admission: EM | Admit: 2023-05-20 | Discharge: 2023-05-20 | Disposition: A | Discharge status: Home / Self Care | Payer: Managed Care, Other (non HMO) | Attending: Internal Medicine | Admitting: Internal Medicine

## 2023-05-20 DIAGNOSIS — R058 Other specified cough: Secondary | ICD-10-CM

## 2023-05-20 DIAGNOSIS — J019 Acute sinusitis, unspecified: Secondary | ICD-10-CM | POA: Diagnosis not present

## 2023-05-20 DIAGNOSIS — R053 Chronic cough: Secondary | ICD-10-CM | POA: Diagnosis not present

## 2023-05-20 MED ORDER — CETIRIZINE HCL 10 MG PO TABS: 10.0000 mg | ORAL_TABLET | Freq: Every day | ORAL | 0 refills | Status: AC

## 2023-05-20 MED ORDER — AMOXICILLIN-POT CLAVULANATE 875-125 MG PO TABS: 1.0000 | ORAL_TABLET | Freq: Two times a day (BID) | ORAL | 0 refills | Status: AC

## 2023-05-20 MED ORDER — PSEUDOEPHEDRINE HCL 60 MG PO TABS: 60.0000 mg | ORAL_TABLET | Freq: Three times a day (TID) | ORAL | 0 refills | Status: AC | PRN

## 2023-05-20 MED ORDER — PROMETHAZINE-DM 6.25-15 MG/5ML PO SYRP: 5.0000 mL | ORAL_SOLUTION | Freq: Three times a day (TID) | ORAL | 0 refills | Status: AC | PRN

## 2023-05-20 NOTE — ED Triage Notes (Signed)
Pt reports cough and congestion x 1 1/2 weeks. Robitussin gives some relief.

## 2023-05-20 NOTE — Discharge Instructions (Signed)
We will manage this as a sinus infection with Augmentin. For sore throat or cough try using a honey-based tea. Use 3 teaspoons of honey with juice squeezed from half lemon. Place shaved pieces of ginger into 1/2-1 cup of water and warm over stove top. Then mix the ingredients and repeat every 4 hours as needed. Please take ibuprofen 600mg  every 6 hours with food alternating with OR taken together with Tylenol 500mg -650mg  every 6 hours for throat pain, fevers, aches and pains. Hydrate very well with at least 2 liters of water. Eat light meals such as soups (chicken and noodles, vegetable, chicken and wild rice).  Do not eat foods that you are allergic to.  Taking an antihistamine like Zyrtec can help against postnasal drainage, sinus congestion which can cause sinus pain, sinus headaches, throat pain, painful swallowing, coughing.  You can take this together with pseudoephedrine (Sudafed) at a dose of 60 mg 3 times a day or twice daily as needed for the same kind of nasal drip, congestion.  Use cough medication as needed.   I will report your chest x-ray results later today.  If there are signs of pneumonia, I will prescribe an additional antibiotic with azithromycin.  If it is negative, follow the regimen that we are outlining.  However, I would be willing to prescribe you prednisone if you continue to have a persistent and bothersome cough this upcoming week on Wednesday or Thursday.  That is when I will return to work.

## 2023-05-20 NOTE — ED Provider Notes (Signed)
[facility]  Note:  This document was prepared using Conservation officer, historic buildings and may include unintentional dictation errors.  MRN: [id number] DOB: 02-08-1991  Subjective:   Ray Mclaughlin is a 32 y.o. male presenting for 10-day history of acute onset persistent bothersome cough that elicits chest pain, chest congestion.  Has also had general malaise fatigue, throat congestion.  No history of asthma.  He does have a history of a tonsillectomy.  No smoking of any kind including cigarettes, cigars, vaping, marijuana use.    No current facility-administered medications for this encounter.  Current Outpatient Medications:    Pseudoephedrine-DM-GG (ROBITUSSIN CF PO), Take by mouth., Disp: , Rfl:    ondansetron (ZOFRAN-ODT) 4 MG disintegrating tablet, 4mg  ODT q4 hours prn nausea/vomit, Disp: 10 tablet, Rfl: 0   Allergies  Allergen Reactions   Apple Juice Anaphylaxis   Carrot [Daucus Carota] Anaphylaxis   Peanuts [Peanut Oil] Anaphylaxis    History reviewed. No pertinent past medical history.   Past Surgical History:  Procedure Laterality Date   ORCHIECTOMY Bilateral 07/21/2015   Procedure: SCROTAL EXPLORATION , RIGHT ORCHIIECTOMY /LEFT ORCHIOPEXY;  Surgeon: Jethro Bolus, MD;  Location: [facility];  Service: Urology;  Laterality: Bilateral;   TONSILLECTOMY  2001    Family History  Adopted: Yes  Family history unknown: Yes    Social History   Tobacco Use   Smoking status: Never   Smokeless tobacco: Never  Vaping Use   Vaping status: Some Days   Devices: only use it sometimes  Substance Use Topics   Alcohol use: No   Drug use: No    ROS   Objective:   Vitals: BP 118/73 (BP Location: Left Arm)   Pulse 66   Temp 98.1 F (36.7 C) (Oral)   Resp 18   SpO2 96%   Physical Exam Constitutional:      General: He is not in acute distress.    Appearance: Normal appearance. He is well-developed and normal weight. He is not ill-appearing,  toxic-appearing or diaphoretic.  HENT:     Head: Normocephalic and atraumatic.     Right Ear: Tympanic membrane, ear canal and external ear normal. No drainage, swelling or tenderness. No middle ear effusion. There is no impacted cerumen. Tympanic membrane is not erythematous or bulging.     Left Ear: Tympanic membrane, ear canal and external ear normal. No drainage, swelling or tenderness.  No middle ear effusion. There is no impacted cerumen. Tympanic membrane is not erythematous or bulging.     Nose: Congestion present. No rhinorrhea.     Mouth/Throat:     Mouth: Mucous membranes are moist.     Pharynx: No oropharyngeal exudate or posterior oropharyngeal erythema.     Comments: Significant postnasal drainage overlying pharynx. Eyes:     General: No scleral icterus.       Right eye: No discharge.        Left eye: No discharge.     Extraocular Movements: Extraocular movements intact.     Conjunctiva/sclera: Conjunctivae normal.  Cardiovascular:     Rate and Rhythm: Normal rate and regular rhythm.     Heart sounds: Normal heart sounds. No murmur heard.    No friction rub. No gallop.  Pulmonary:     Effort: Pulmonary effort is normal. No respiratory distress.     Breath sounds: No stridor. No wheezing, rhonchi or rales.     Comments: Decreased lung sounds in bibasilar fields. Musculoskeletal:     Cervical  back: Normal range of motion and neck supple. No rigidity. No muscular tenderness.  Neurological:     General: No focal deficit present.     Mental Status: He is alert and oriented to person, place, and time.  Psychiatric:        Mood and Affect: Mood normal.        Behavior: Behavior normal.        Thought Content: Thought content normal.     Assessment and Plan :   PDMP not reviewed this encounter.  1. Acute non-recurrent sinusitis, unspecified location   2. Persistent cough    X-ray over-read was pending at time of discharge, recommended follow up with only abnormal  results. Otherwise will not call for negative over-read. Patient was in agreement.  Will start empiric treatment for sinusitis with Augmentin.  Recommended supportive care otherwise.  If x-ray results show pneumonia, recommend coverage with azithromycin.  Otherwise continue with the aforementioned plan to address sinusitis with Augmentin.  I did offer patient a prescription for prednisone should he continue to have a bothersome cough with a negative chest x-ray this upcoming week on Wednesday or Thursday when I return to work.  Counseled patient on potential for adverse effects with medications prescribed/recommended today, ER and return-to-clinic precautions discussed, patient verbalized understanding.    Wallis Bamberg, [place] 05/20/23 1610

## 2023-10-12 ENCOUNTER — Telehealth: Payer: Self-pay | Admitting: Nurse Practitioner

## 2023-10-12 NOTE — Telephone Encounter (Signed)
 Lvmtcb to schedule appt if needed.
